# Patient Record
Sex: Female | Born: 1995 | Hispanic: Yes | Marital: Married | Smoking: Never smoker
Health system: Southern US, Community
[De-identification: ages and names within clinical notes are randomized; demographics above are authoritative.]

## PROBLEM LIST (undated history)

## (undated) ENCOUNTER — Inpatient Hospital Stay (HOSPITAL_COMMUNITY): Payer: Self-pay

## (undated) DIAGNOSIS — Z9889 Other specified postprocedural states: Secondary | ICD-10-CM

## (undated) DIAGNOSIS — Z789 Other specified health status: Secondary | ICD-10-CM

## (undated) DIAGNOSIS — O24419 Gestational diabetes mellitus in pregnancy, unspecified control: Secondary | ICD-10-CM

## (undated) HISTORY — DX: Gestational diabetes mellitus in pregnancy, unspecified control: O24.419

## (undated) HISTORY — PX: HAND SURGERY: SHX662

---
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
## 2016-06-18 ENCOUNTER — Inpatient Hospital Stay (HOSPITAL_COMMUNITY)
Admission: AD | Admit: 2016-06-18 | Discharge: 2016-06-18 | Disposition: A | Discharge status: Home / Self Care | Payer: Medicaid Other | Source: Ambulatory Visit | Attending: Obstetrics and Gynecology | Admitting: Obstetrics and Gynecology

## 2016-06-18 ENCOUNTER — Encounter (HOSPITAL_COMMUNITY): Payer: Self-pay | Admitting: *Deleted

## 2016-06-18 ENCOUNTER — Emergency Department (HOSPITAL_COMMUNITY)
Admission: EM | Admit: 2016-06-18 | Discharge: 2016-06-18 | Disposition: A | Discharge status: Home / Self Care | Payer: Medicaid Other

## 2016-06-18 ENCOUNTER — Inpatient Hospital Stay (HOSPITAL_COMMUNITY): Payer: Medicaid Other

## 2016-06-18 DIAGNOSIS — O209 Hemorrhage in early pregnancy, unspecified: Secondary | ICD-10-CM | POA: Insufficient documentation

## 2016-06-18 DIAGNOSIS — O4691 Antepartum hemorrhage, unspecified, first trimester: Secondary | ICD-10-CM | POA: Diagnosis not present

## 2016-06-18 DIAGNOSIS — R109 Unspecified abdominal pain: Secondary | ICD-10-CM | POA: Insufficient documentation

## 2016-06-18 DIAGNOSIS — Z3A08 8 weeks gestation of pregnancy: Secondary | ICD-10-CM | POA: Insufficient documentation

## 2016-06-18 DIAGNOSIS — O26891 Other specified pregnancy related conditions, first trimester: Secondary | ICD-10-CM | POA: Diagnosis not present

## 2016-06-18 DIAGNOSIS — N939 Abnormal uterine and vaginal bleeding, unspecified: Secondary | ICD-10-CM | POA: Diagnosis present

## 2016-06-18 HISTORY — DX: Other specified health status: Z78.9

## 2016-06-18 LAB — URINE MICROSCOPIC-ADD ON

## 2016-06-18 LAB — CBC
HEMATOCRIT: 36.9 % (ref 36.0–46.0)
HEMOGLOBIN: 12.6 g/dL (ref 12.0–15.0)
MCH: 28.6 pg (ref 26.0–34.0)
MCHC: 34.1 g/dL (ref 30.0–36.0)
MCV: 83.9 fL (ref 78.0–100.0)
Platelets: 278 10*3/uL (ref 150–400)
RBC: 4.4 MIL/uL (ref 3.87–5.11)
RDW: 14.3 % (ref 11.5–15.5)
WBC: 7.9 10*3/uL (ref 4.0–10.5)

## 2016-06-18 LAB — POCT PREGNANCY, URINE: Preg Test, Ur: POSITIVE — AB

## 2016-06-18 LAB — URINALYSIS, ROUTINE W REFLEX MICROSCOPIC
Bilirubin Urine: NEGATIVE
GLUCOSE, UA: NEGATIVE mg/dL
Ketones, ur: NEGATIVE mg/dL
Leukocytes, UA: NEGATIVE
Nitrite: NEGATIVE
PH: 7 (ref 5.0–8.0)
Protein, ur: NEGATIVE mg/dL
Specific Gravity, Urine: 1.01 (ref 1.005–1.030)

## 2016-06-18 LAB — WET PREP, GENITAL
CLUE CELLS WET PREP: NONE SEEN
SPERM: NONE SEEN
Trich, Wet Prep: NONE SEEN
WBC WET PREP: NONE SEEN
Yeast Wet Prep HPF POC: NONE SEEN

## 2016-06-18 LAB — ABO/RH: ABO/RH(D): A POS

## 2016-06-18 LAB — HCG, QUANTITATIVE, PREGNANCY: hCG, Beta Chain, Quant, S: 11071 m[IU]/mL — ABNORMAL HIGH (ref <5)

## 2016-06-18 NOTE — MAU Note (Addendum)
Pt had pos UPT @ Urgent Care a week ago.  Pt also states she has lower back pain as well as lower abd cramping.

## 2016-06-18 NOTE — MAU Note (Signed)
Has been spotting for the past wk, today became heavier- passing clots- dime sized or smaller.  Having some cramping in lower abd.  Had brown d/c

## 2016-06-18 NOTE — MAU Provider Note (Signed)
History     CSN: [id number]  Arrival date and time: 06/18/16 1718   First Provider Initiated Contact with Patient 06/18/16 1820      Chief Complaint  Patient presents with  . Vaginal Bleeding  . Abdominal Cramping  . Possible Pregnancy   HPI Eileen Morris is 20 y.o. G1P0 [redacted]w[redacted]d weeks presenting with cramping on and off starting last week, worsened today.  Pain is in the lower abdomen. Rates pain at this time 5 /10.  Nothing makes it worse or better.  Spotting, light pink off and on seen with wiping X 2 weeks.  Now bleeding is more red, like period and with small clots and brown discharge.  She has not begun prenatal care, plans in [place].    Past Medical History  Diagnosis Date  . Medical history non-contributory     Past Surgical History  Procedure Laterality Date  . Hand surgery      No family history on file.  Social History  Substance Use Topics  . Smoking status: Never Smoker   . Smokeless tobacco: None  . Alcohol Use: No    Allergies: No Known Allergies  Prescriptions prior to admission  Medication Sig Dispense Refill Last Dose  . acetaminophen (TYLENOL) 500 MG tablet Take 500 mg by mouth every 6 (six) hours as needed for headache.   06/17/2016 at Unknown time  . Prenatal Vit-Fe Fumarate-FA (PRENATAL MULTIVITAMIN) TABS tablet Take 1 tablet by mouth daily at 12 noon.   06/17/2016 at Unknown time    Review of Systems  Constitutional: Negative for fever and chills.  Cardiovascular: Negative for chest pain.  Gastrointestinal: Positive for abdominal pain (lower abdominal pain). Negative for nausea and vomiting.  Genitourinary: Negative for dysuria, urgency, frequency and hematuria.       + for vaginal bleeding with small clots  Neurological: Negative for headaches.   Physical Exam   Blood pressure 119/61, pulse 94, temperature 98.5 F (36.9 C), temperature source Oral, resp. rate 18, weight 165 lb (74.844 kg), last menstrual period 04/18/2016.  Physical Exam   Constitutional: She is oriented to person, place, and time. She appears well-developed and well-nourished. No distress.  HENT:  Head: Normocephalic.  Neck: Normal range of motion.  Cardiovascular: Normal rate.   Respiratory: Effort normal.  GI: Soft. She exhibits no distension and no mass. There is no tenderness. There is no rebound and no guarding.  Genitourinary: There is no rash, tenderness or lesion on the right labia. There is no rash, tenderness or lesion on the left labia. Uterus is tender. Uterus is not enlarged (mild tenderness ). Cervix exhibits no discharge. Right adnexum displays no mass, no tenderness and no fullness. Left adnexum displays no mass, no tenderness and no fullness. There is bleeding (small amount of dark red blood.  I did not see clots) in the vagina. No erythema or tenderness in the vagina. No vaginal discharge found.  Neurological: She is alert and oriented to person, place, and time.  Skin: Skin is warm and dry.  Psychiatric: She has a normal mood and affect. Her behavior is normal.   Results for orders placed or performed during the hospital encounter of 06/18/16 (from the past 24 hour(s))  Urinalysis, Routine w reflex microscopic (not at [facility])     Status: Abnormal   Collection Time: 06/18/16  5:30 PM  Result Value Ref Range   Color, Urine AMBER (A) YELLOW   APPearance HAZY (A) CLEAR   Specific Gravity, Urine 1.010 1.005 -  1.030   pH 7.0 5.0 - 8.0   Glucose, UA NEGATIVE NEGATIVE mg/dL   Hgb urine dipstick LARGE (A) NEGATIVE   Bilirubin Urine NEGATIVE NEGATIVE   Ketones, ur NEGATIVE NEGATIVE mg/dL   Protein, ur NEGATIVE NEGATIVE mg/dL   Nitrite NEGATIVE NEGATIVE   Leukocytes, UA NEGATIVE NEGATIVE  Urine microscopic-add on     Status: Abnormal   Collection Time: 06/18/16  5:30 PM  Result Value Ref Range   Squamous Epithelial / LPF 0-5 (A) NONE SEEN   WBC, UA 0-5 0 - 5 WBC/hpf   RBC / HPF 0-5 0 - 5 RBC/hpf   Bacteria, UA RARE (A) NONE SEEN  Pregnancy,  urine POC     Status: Abnormal   Collection Time: 06/18/16  6:10 PM  Result Value Ref Range   Preg Test, Ur POSITIVE (A) NEGATIVE  Wet prep, genital     Status: None   Collection Time: 06/18/16  6:32 PM  Result Value Ref Range   Yeast Wet Prep HPF POC NONE SEEN NONE SEEN   Trich, Wet Prep NONE SEEN NONE SEEN   Clue Cells Wet Prep HPF POC NONE SEEN NONE SEEN   WBC, Wet Prep HPF POC NONE SEEN NONE SEEN   Sperm NONE SEEN   CBC     Status: None   Collection Time: 06/18/16  6:35 PM  Result Value Ref Range   WBC 7.9 4.0 - 10.5 K/uL   RBC 4.40 3.87 - 5.11 MIL/uL   Hemoglobin 12.6 12.0 - 15.0 g/dL   HCT 16.1 09.6 - 04.5 %   MCV 83.9 78.0 - 100.0 fL   MCH 28.6 26.0 - 34.0 pg   MCHC 34.1 30.0 - 36.0 g/dL   RDW 40.9 81.1 - 91.4 %   Platelets 278 150 - 400 K/uL  ABO/Rh     Status: None (Preliminary result)   Collection Time: 06/18/16  6:36 PM  Result Value Ref Range   ABO/RH(D) A POS   hCG, quantitative, pregnancy     Status: Abnormal   Collection Time: 06/18/16  6:36 PM  Result Value Ref Range   hCG, Beta Chain, Quant, S 11071 (H) <5 mIU/mL    US Ob Comp Less 14 Wks  06/18/2016  CLINICAL DATA:  Pregnant patient in first-trimester pregnancy with vaginal bleeding and lower abdominal cramping for 1 week. EXAM: OBSTETRIC <14 WK [place] AND TRANSVAGINAL OB US TECHNIQUE: Both transabdominal and transvaginal ultrasound examinations were performed for complete evaluation of the gestation as well as the maternal uterus, adnexal regions, and pelvic cul-de-sac. Transvaginal technique was performed to assess early pregnancy. COMPARISON:  None. FINDINGS: Intrauterine gestational sac: Single. Yolk sac:  Present. Embryo:  Not visualized. Cardiac Activity: Not visualized. MSD: 12.5  mm   6 w   0  d Subchorionic hemorrhage:  None visualized. Maternal uterus/adnexae: Both ovaries are visualized and normal. Trace pelvic free fluid. IMPRESSION: Probable early intrauterine gestational sac and yolk sac, but no fetal  pole or cardiac activity yet visualized. Recommend follow-up quantitative B-HCG levels and follow-up US in 10 days to confirm and assess viability. This recommendation follows SRU consensus guidelines: Diagnostic Criteria for Nonviable Pregnancy Early in the First Trimester. Malva Limes Med 2013; 782:9562-13. Electronically Signed   By: Rubye Oaks M.D.   On: 06/18/2016 19:34   US Ob Transvaginal  06/18/2016  CLINICAL DATA:  Pregnant patient in first-trimester pregnancy with vaginal bleeding and lower abdominal cramping for 1 week. EXAM: OBSTETRIC <14 WK [place] AND  TRANSVAGINAL OB US TECHNIQUE: Both transabdominal and transvaginal ultrasound examinations were performed for complete evaluation of the gestation as well as the maternal uterus, adnexal regions, and pelvic cul-de-sac. Transvaginal technique was performed to assess early pregnancy. COMPARISON:  None. FINDINGS: Intrauterine gestational sac: Single. Yolk sac:  Present. Embryo:  Not visualized. Cardiac Activity: Not visualized. MSD: 12.5  mm   6 w   0  d Subchorionic hemorrhage:  None visualized. Maternal uterus/adnexae: Both ovaries are visualized and normal. Trace pelvic free fluid. IMPRESSION: Probable early intrauterine gestational sac and yolk sac, but no fetal pole or cardiac activity yet visualized. Recommend follow-up quantitative B-HCG levels and follow-up US in 10 days to confirm and assess viability. This recommendation follows SRU consensus guidelines: Diagnostic Criteria for Nonviable Pregnancy Early in the First Trimester. Malva Limes Engl J Med 2013; 161:0960-45; 369:1443-51. Electronically Signed   By: Rubye OaksMelanie  Ehinger M.D.   On: 06/18/2016 19:34   MAU Course  Procedures  GC/CHL culture and HIV results pending  MDM MSE Labs Exam U/S Reviewed testing results with the patient.   Assessment and Plan  A: Vaginal bleeding in first trimester pregnancy      Abdominal pain in first trimester pregnancy       Intrauterine Gestational Sac with Yolk Sac seen  on U/S      [id number] by U/S  (dating by LMP of [id number])  P: Pelvic rest until bleeding stops      Begin prenatal care with MD of your choice      Return for worsening sxs  Eileen Morris,EVE M 06/18/2016, 8:00 PM

## 2016-06-18 NOTE — Discharge Instructions (Signed)
Vaginal Bleeding During Pregnancy, First Trimester °A small amount of bleeding (spotting) from the vagina is common in early pregnancy. Sometimes the bleeding is normal and is not a problem, and sometimes it is a sign of something serious. Be sure to tell your doctor about any bleeding from your vagina right away. °HOME CARE °· Watch your condition for any changes. °· Follow your doctor's instructions about how active you can be. °· If you are on bed rest: °¨ You may need to stay in bed and only get up to use the bathroom. °¨ You may be allowed to do some activities. °¨ If you need help, make plans for someone to help you. °· Write down: °¨ The number of pads you use each day. °¨ How often you change pads. °¨ How soaked (saturated) your pads are. °· Do not use tampons. °· Do not douche. °· Do not have sex or orgasms until your doctor says it is okay. °· If you pass any tissue from your vagina, save the tissue so you can show it to your doctor. °· Only take medicines as told by your doctor. °· Do not take aspirin because it can make you bleed. °· Keep all follow-up visits as told by your doctor. °GET HELP IF:  °· You bleed from your vagina. °· You have cramps. °· You have labor pains. °· You have a fever that does not go away after you take medicine. °GET HELP RIGHT AWAY IF:  °· You have very bad cramps in your back or belly (abdomen). °· You pass large clots or tissue from your vagina. °· You bleed more. °· You feel light-headed or weak. °· You pass out (faint). °· You have chills. °· You are leaking fluid or have a gush of fluid from your vagina. °· You pass out while pooping (having a bowel movement). °MAKE SURE YOU: °· Understand these instructions. °· Will watch your condition. °· Will get help right away if you are not doing well or get worse. °  °This information is not intended to replace advice given to you by your health care provider. Make sure you discuss any questions you have with your health care  provider. °  °Document Released: 04/05/2014 Document Reviewed: 04/05/2014 °Elsevier Interactive Patient Education ©2016 Elsevier Inc. ° °Pelvic Rest °Pelvic rest is sometimes recommended for women when:  °· The placenta is partially or completely covering the opening of the cervix (placenta previa). °· There is bleeding between the uterine wall and the amniotic sac in the first trimester (subchorionic hemorrhage). °· The cervix begins to open without labor starting (incompetent cervix, cervical insufficiency). °· The labor is too early (preterm labor). °HOME CARE INSTRUCTIONS °· Do not have sexual intercourse, stimulation, or an orgasm. °· Do not use tampons, douche, or put anything in the vagina. °· Do not lift anything over 10 pounds (4.5 kg). °· Avoid strenuous activity or straining your pelvic muscles. °SEEK MEDICAL CARE IF:  °· You have any vaginal bleeding during pregnancy. Treat this as a potential emergency. °· You have cramping pain felt low in the stomach (stronger than menstrual cramps). °· You notice vaginal discharge (watery, mucus, or bloody). °· You have a low, dull backache. °· There are regular contractions or uterine tightening. °SEEK IMMEDIATE MEDICAL CARE IF: °You have vaginal bleeding and have placenta previa.  °  °This information is not intended to replace advice given to you by your health care provider. Make sure you discuss any questions you have with   your health care provider. °  °Document Released: 03/16/2011 Document Revised: 02/11/2012 Document Reviewed: 05/23/2015 °Elsevier Interactive Patient Education ©2016 Elsevier Inc. ° °

## 2016-06-19 ENCOUNTER — Encounter (HOSPITAL_COMMUNITY): Payer: Self-pay | Admitting: *Deleted

## 2016-06-19 ENCOUNTER — Inpatient Hospital Stay (HOSPITAL_COMMUNITY): Payer: Medicaid Other

## 2016-06-19 ENCOUNTER — Inpatient Hospital Stay (HOSPITAL_COMMUNITY)
Admission: AD | Admit: 2016-06-19 | Discharge: 2016-06-19 | Disposition: A | Discharge status: Home / Self Care | Payer: Medicaid Other | Source: Ambulatory Visit | Attending: Obstetrics & Gynecology | Admitting: Obstetrics & Gynecology

## 2016-06-19 DIAGNOSIS — O26892 Other specified pregnancy related conditions, second trimester: Secondary | ICD-10-CM | POA: Diagnosis present

## 2016-06-19 DIAGNOSIS — O209 Hemorrhage in early pregnancy, unspecified: Secondary | ICD-10-CM | POA: Insufficient documentation

## 2016-06-19 DIAGNOSIS — Z3A08 8 weeks gestation of pregnancy: Secondary | ICD-10-CM | POA: Insufficient documentation

## 2016-06-19 DIAGNOSIS — R109 Unspecified abdominal pain: Secondary | ICD-10-CM | POA: Diagnosis present

## 2016-06-19 DIAGNOSIS — O200 Threatened abortion: Secondary | ICD-10-CM

## 2016-06-19 LAB — GC/CHLAMYDIA PROBE AMP (~~LOC~~) NOT AT ARMC
Chlamydia: NEGATIVE
Neisseria Gonorrhea: NEGATIVE

## 2016-06-19 LAB — HIV ANTIBODY (ROUTINE TESTING W REFLEX): HIV Screen 4th Generation wRfx: NONREACTIVE

## 2016-06-19 MED ORDER — OXYCODONE-ACETAMINOPHEN 5-325 MG PO TABS: 1.0000 | ORAL_TABLET | ORAL | Status: DC | PRN

## 2016-06-19 MED ORDER — HYDROMORPHONE HCL 1 MG/ML IJ SOLN
0.5000 mg | Freq: Once | INTRAMUSCULAR | Status: AC
  Administered 2016-06-19: 0.5 mg via INTRAMUSCULAR
  Filled 2016-06-19: qty 1

## 2016-06-19 NOTE — MAU Note (Signed)
Patient in [place]. 

## 2016-06-19 NOTE — MAU Provider Note (Signed)
Chief Complaint: Vaginal Bleeding and Abdominal Pain   Provider saw patient at 1840   SUBJECTIVE  PI: Eileen Morris is a 20 y.o. G1P0 at 6576w6d by LMP who presents to maternity admissions reporting increased bleeding and cramping today.  Passed a grape sized blood clot, worried it is the baby.  She denies vaginal itching/burning, urinary symptoms, h/a, dizziness, n/v, or fever/chills.    Seen yesterday for bleeding and cramping.  US showed gestational sac and yolk sac.  This effectively ruled out ectopic so she was discharged home.    Vaginal Bleeding The patient's primary symptoms include pelvic pain and vaginal bleeding. The patient's pertinent negatives include no genital itching, genital lesions or genital odor. This is a recurrent problem. The current episode started today. The problem occurs intermittently. The pain is mild. The problem affects both sides. She is pregnant. Associated symptoms include abdominal pain. Pertinent negatives include no back pain, chills, constipation, diarrhea, fever, headaches or nausea. The vaginal discharge was bloody. The vaginal bleeding is typical of menses. She has been passing clots. She has been passing tissue. Nothing aggravates the symptoms. She has tried nothing for the symptoms.  Abdominal Pain Pertinent negatives include no constipation, diarrhea, fever, headaches or nausea.    Past Medical History  Diagnosis Date  . Medical history non-contributory    Past Surgical History  Procedure Laterality Date  . Hand surgery     Social History   Social History  . Marital Status: Single    Spouse Name: N/A  . Number of Children: N/A  . Years of Education: N/A   Occupational History  . Not on file.   Social History Main Topics  . Smoking status: Never Smoker   . Smokeless tobacco: Not on file  . Alcohol Use: No  . Drug Use: No  . Sexual Activity: Yes   Other Topics Concern  . Not on file   Social History Narrative   No current  facility-administered medications on file prior to encounter.   Current Outpatient Prescriptions on File Prior to Encounter  Medication Sig Dispense Refill  . acetaminophen (TYLENOL) 500 MG tablet Take 500 mg by mouth every 6 (six) hours as needed for headache.    . Prenatal Vit-Fe Fumarate-FA (PRENATAL MULTIVITAMIN) TABS tablet Take 1 tablet by mouth daily at 12 noon.     No Known Allergies  I have reviewed patient's Past Medical Hx, Surgical Hx, Family Hx, Social Hx, medications and allergies.   ROS:  Review of Systems  Constitutional: Negative for fever and chills.  Gastrointestinal: Positive for abdominal pain. Negative for nausea, diarrhea and constipation.  Genitourinary: Positive for vaginal bleeding and pelvic pain.  Musculoskeletal: Negative for back pain.  Neurological: Negative for headaches.   Other systems negative  Physical Exam  Patient Vitals for the past 24 hrs:  BP Temp Temp src Pulse Resp  06/19/16 1836 136/70 mmHg 98 F (36.7 C) Oral 116 20    Physical Exam  Constitutional: Well-developed, well-nourished female in no acute distress.  Cardiovascular: normal rate Respiratory: normal effort GI: Abd soft, non-tender. Pos BS x 4 MS: Extremities nontender, no edema, normal ROM Neurologic: Alert and oriented x 4.  GU: Neg CVAT.  Moderate bleeding, small clot on pad.  LAB RESULTS No results found for this or any previous visit (from the past 24 hour(s)).  --/--/A POS (07/17 1836)  IMAGING [place] Ob Comp Less 14 Wks  06/18/2016  CLINICAL DATA:  Pregnant patient in first-trimester pregnancy with vaginal  bleeding and lower abdominal cramping for 1 week. EXAM: OBSTETRIC <14 WK [place] AND TRANSVAGINAL OB US TECHNIQUE: Both transabdominal and transvaginal ultrasound examinations were performed for complete evaluation of the gestation as well as the maternal uterus, adnexal regions, and pelvic cul-de-sac. Transvaginal technique was performed to assess early pregnancy.  COMPARISON:  None. FINDINGS: Intrauterine gestational sac: Single. Yolk sac:  Present. Embryo:  Not visualized. Cardiac Activity: Not visualized. MSD: 12.5  mm   6 w   0  d Subchorionic hemorrhage:  None visualized. Maternal uterus/adnexae: Both ovaries are visualized and normal. Trace pelvic free fluid. IMPRESSION: Probable early intrauterine gestational sac and yolk sac, but no fetal pole or cardiac activity yet visualized. Recommend follow-up quantitative B-HCG levels and follow-up US in 10 days to confirm and assess viability. This recommendation follows SRU consensus guidelines: Diagnostic Criteria for Nonviable Pregnancy Early in the First Trimester. Malva Limes Med 2013; 454:0981-19. Electronically Signed   By: Rubye Oaks M.D.   On: 06/18/2016 19:34   US Ob Transvaginal  06/19/2016  CLINICAL DATA:  Pregnant patient in first-trimester pregnancy with increased vaginal bleeding. EXAM: TRANSVAGINAL OB ULTRASOUND TECHNIQUE: Transvaginal ultrasound was performed for complete evaluation of the gestation as well as the maternal uterus, adnexal regions, and pelvic cul-de-sac. COMPARISON:  Obstetric ultrasound yesterday at 1911 hour FINDINGS: Intrauterine gestational sac: Present, however is now located in the lower uterine segment. Yolk sac:  Present. Embryo:  Not visualized. Cardiac Activity: Not visualized. MSD: 13  mm   6 w   1  d Subchorionic hemorrhage:  None visualized. Maternal uterus/adnexae: Both ovaries are visualized and are normal. There is no pelvic free fluid. IMPRESSION: The intrauterine gestational sac containing a yolk sac is again seen, however is now located in the lower uterine segment, a change from exam performed yesterday. No fetal pole or cardiac activity. Findings are suspicious for failed pregnancy. Electronically Signed   By: Rubye Oaks M.D.   On: 06/19/2016 19:44   US Ob Transvaginal  06/18/2016  CLINICAL DATA:  Pregnant patient in first-trimester pregnancy with vaginal  bleeding and lower abdominal cramping for 1 week. EXAM: OBSTETRIC <14 WK [place] AND TRANSVAGINAL OB US TECHNIQUE: Both transabdominal and transvaginal ultrasound examinations were performed for complete evaluation of the gestation as well as the maternal uterus, adnexal regions, and pelvic cul-de-sac. Transvaginal technique was performed to assess early pregnancy. COMPARISON:  None. FINDINGS: Intrauterine gestational sac: Single. Yolk sac:  Present. Embryo:  Not visualized. Cardiac Activity: Not visualized. MSD: 12.5  mm   6 w   0  d Subchorionic hemorrhage:  None visualized. Maternal uterus/adnexae: Both ovaries are visualized and normal. Trace pelvic free fluid. IMPRESSION: Probable early intrauterine gestational sac and yolk sac, but no fetal pole or cardiac activity yet visualized. Recommend follow-up quantitative B-HCG levels and follow-up US in 10 days to confirm and assess viability. This recommendation follows SRU consensus guidelines: Diagnostic Criteria for Nonviable Pregnancy Early in the First Trimester. Malva Limes Med 2013; 147:8295-62. Electronically Signed   By: Rubye Oaks M.D.   On: 06/18/2016 19:34     MAU Management/MDM: Follow up ultrasound ordered to determine presence of gestational sac  Discussed US findings.  Patient and family told this is likely a miscarriage in progress.  Though we cannot say this 100%, the change in location inside uterus indicates high probability.  Offered follow up with our clinic.  Patient states has appt in [place] in [place], and wants to follow up there.  This bleeding/pain can represent a normal pregnancy with bleeding, spontaneous abortion or even an ectopic which can be life-threatening.  The process as listed above helps to determine which of these is present.    ASSESSMENT 1. Bleeding in early pregnancy   2.     Gestational sac lower in uterus  PLAN Discharge home Follow up at [facility] in [place] Bleeding precautions     Medication List    ASK your doctor about these medications        acetaminophen 500 MG tablet  Commonly known as:  TYLENOL  Take 500 mg by mouth every 6 (six) hours as needed for headache.     prenatal multivitamin Tabs tablet  Take 1 tablet by mouth daily at 12 noon.        Pt stable at time of discharge. Encouraged to return here or to other Urgent Care/ED if she develops worsening of symptoms, increase in pain, fever, or other concerning symptoms.    Wynelle Bourgeois CNM, MSN Certified Nurse-Midwife 06/19/2016  7:00 PM

## 2016-06-19 NOTE — Discharge Instructions (Signed)
Pelvic Rest °Pelvic rest is sometimes recommended for women when:  °· The placenta is partially or completely covering the opening of the cervix (placenta previa). °· There is bleeding between the uterine wall and the amniotic sac in the first trimester (subchorionic hemorrhage). °· The cervix begins to open without labor starting (incompetent cervix, cervical insufficiency). °· The labor is too early (preterm labor). °HOME CARE INSTRUCTIONS °· Do not have sexual intercourse, stimulation, or an orgasm. °· Do not use tampons, douche, or put anything in the vagina. °· Do not lift anything over 10 pounds (4.5 kg). °· Avoid strenuous activity or straining your pelvic muscles. °SEEK MEDICAL CARE IF:  °· You have any vaginal bleeding during pregnancy. Treat this as a potential emergency. °· You have cramping pain felt low in the stomach (stronger than menstrual cramps). °· You notice vaginal discharge (watery, mucus, or bloody). °· You have a low, dull backache. °· There are regular contractions or uterine tightening. °SEEK IMMEDIATE MEDICAL CARE IF: °You have vaginal bleeding and have placenta previa.  °  °This information is not intended to replace advice given to you by your health care provider. Make sure you discuss any questions you have with your health care provider. °  °Document Released: 03/16/2011 Document Revised: 02/11/2012 Document Reviewed: 05/23/2015 °Elsevier Interactive Patient Education ©2016 Elsevier Inc. ° °Vaginal Bleeding During Pregnancy, First Trimester °A small amount of bleeding (spotting) from the vagina is relatively common in early pregnancy. It usually stops on its own. Various things may cause bleeding or spotting in early pregnancy. Some bleeding may be related to the pregnancy, and some may not. In most cases, the bleeding is normal and is not a problem. However, bleeding can also be a sign of something serious. Be sure to tell your health care provider about any vaginal bleeding right  away. °Some possible causes of vaginal bleeding during the first trimester include: °· Infection or inflammation of the cervix. °· Growths (polyps) on the cervix. °· Miscarriage or threatened miscarriage. °· Pregnancy tissue has developed outside of the uterus and in a fallopian tube (tubal pregnancy). °· Tiny cysts have developed in the uterus instead of pregnancy tissue (molar pregnancy). °HOME CARE INSTRUCTIONS  °Watch your condition for any changes. The following actions may help to lessen any discomfort you are feeling: °· Follow your health care provider's instructions for limiting your activity. If your health care provider orders bed rest, you may need to stay in bed and only get up to use the bathroom. However, your health care provider may allow you to continue light activity. °· If needed, make plans for someone to help with your regular activities and responsibilities while you are on bed rest. °· Keep track of the number of pads you use each day, how often you change pads, and how soaked (saturated) they are. Write this down. °· Do not use tampons. Do not douche. °· Do not have sexual intercourse or orgasms until approved by your health care provider. °· If you pass any tissue from your vagina, save the tissue so you can show it to your health care provider. °· Only take over-the-counter or prescription medicines as directed by your health care provider. °· Do not take aspirin because it can make you bleed. °· Keep all follow-up appointments as directed by your health care provider. °SEEK MEDICAL CARE IF: °· You have any vaginal bleeding during any part of your pregnancy. °· You have cramps or labor pains. °· You have a fever, not   controlled by medicine. °SEEK IMMEDIATE MEDICAL CARE IF:  °· You have severe cramps in your back or belly (abdomen). °· You pass large clots or tissue from your vagina. °· Your bleeding increases. °· You feel light-headed or weak, or you have fainting episodes. °· You have  chills. °· You are leaking fluid or have a gush of fluid from your vagina. °· You pass out while having a bowel movement. °MAKE SURE YOU: °· Understand these instructions. °· Will watch your condition. °· Will get help right away if you are not doing well or get worse. °  °This information is not intended to replace advice given to you by your health care provider. Make sure you discuss any questions you have with your health care provider. °  °Document Released: 08/29/2005 Document Revised: 11/24/2013 Document Reviewed: 07/27/2013 °Elsevier Interactive Patient Education ©2016 Elsevier Inc. ° °

## 2016-06-19 NOTE — MAU Note (Addendum)
Pt seen in MAU yesterday, bleeding has become heavier today, passing more clots.  Lower abd pain also more intense today.  "I think I'm passing something big right now,"

## 2016-11-29 ENCOUNTER — Encounter (HOSPITAL_COMMUNITY): Payer: Self-pay

## 2017-04-23 ENCOUNTER — Encounter (HOSPITAL_COMMUNITY): Payer: Self-pay

## 2017-11-12 ENCOUNTER — Encounter: Payer: Medicaid Other | Admitting: Certified Nurse Midwife

## 2017-11-12 ENCOUNTER — Inpatient Hospital Stay (HOSPITAL_COMMUNITY)
Admission: AD | Admit: 2017-11-12 | Discharge: 2017-11-12 | Disposition: A | Discharge status: Home / Self Care | Payer: Medicaid Other | Source: Ambulatory Visit | Attending: Family Medicine | Admitting: Family Medicine

## 2017-11-12 ENCOUNTER — Other Ambulatory Visit: Payer: Self-pay

## 2017-11-12 ENCOUNTER — Encounter (HOSPITAL_COMMUNITY): Payer: Self-pay | Admitting: *Deleted

## 2017-11-12 DIAGNOSIS — J069 Acute upper respiratory infection, unspecified: Secondary | ICD-10-CM | POA: Diagnosis not present

## 2017-11-12 DIAGNOSIS — O99511 Diseases of the respiratory system complicating pregnancy, first trimester: Secondary | ICD-10-CM | POA: Diagnosis not present

## 2017-11-12 DIAGNOSIS — O9989 Other specified diseases and conditions complicating pregnancy, childbirth and the puerperium: Secondary | ICD-10-CM | POA: Diagnosis not present

## 2017-11-12 DIAGNOSIS — Z3A13 13 weeks gestation of pregnancy: Secondary | ICD-10-CM | POA: Diagnosis not present

## 2017-11-12 DIAGNOSIS — J029 Acute pharyngitis, unspecified: Secondary | ICD-10-CM | POA: Diagnosis present

## 2017-11-12 LAB — INFLUENZA PANEL BY PCR (TYPE A & B)
INFLAPCR: NEGATIVE
INFLBPCR: NEGATIVE

## 2017-11-12 LAB — URINALYSIS, ROUTINE W REFLEX MICROSCOPIC
Bilirubin Urine: NEGATIVE
Glucose, UA: 50 mg/dL — AB
HGB URINE DIPSTICK: NEGATIVE
Ketones, ur: NEGATIVE mg/dL
LEUKOCYTES UA: NEGATIVE
NITRITE: NEGATIVE
PROTEIN: NEGATIVE mg/dL
Specific Gravity, Urine: 1.021 (ref 1.005–1.030)
pH: 5 (ref 5.0–8.0)

## 2017-11-12 LAB — RAPID STREP SCREEN (MED CTR MEBANE ONLY): STREPTOCOCCUS, GROUP A SCREEN (DIRECT): NEGATIVE

## 2017-11-12 MED ORDER — ONDANSETRON 8 MG PO TBDP: 8.0000 mg | ORAL_TABLET | Freq: Three times a day (TID) | ORAL | 0 refills | Status: DC | PRN

## 2017-11-12 NOTE — MAU Provider Note (Signed)
History     CSN: [id number]  Arrival date and time: 11/12/17 [id number]   First Provider Initiated Contact with Patient 11/12/17 1005      Chief Complaint  Patient presents with  . Fever  . Sore Throat  . Earache   Eileen Morris is a 21 y.o. G2P0 at Unknown who presents today with fever, sore throat, and earache. She states that over the last couple of days she was having a lot of phlegm. She was vomiting up the phlegm, and now feels like she is vomiting "all the time".    URI   This is a new problem. The current episode started yesterday. The problem has been unchanged. The maximum temperature recorded prior to her arrival was 100.4 - 100.9 F. The fever has been present for less than 1 day. Associated symptoms include ear pain, a sore throat and vomiting. She has tried acetaminophen for the symptoms. The treatment provided mild relief.    Past Medical History:  Diagnosis Date  . Medical history non-contributory     Past Surgical History:  Procedure Laterality Date  . HAND SURGERY      No family history on file.  Social History   Tobacco Use  . Smoking status: Never Smoker  Substance Use Topics  . Alcohol use: No  . Drug use: No    Allergies: No Known Allergies  Medications Prior to Admission  Medication Sig Dispense Refill Last Dose  . acetaminophen (TYLENOL) 500 MG tablet Take 500 mg by mouth every 6 (six) hours as needed for headache.   06/17/2016 at Unknown time  . oxyCODONE-acetaminophen (PERCOCET/ROXICET) 5-325 MG tablet Take 1 tablet by mouth every 4 (four) hours as needed. 15 tablet 0   . Prenatal Vit-Fe Fumarate-FA (PRENATAL MULTIVITAMIN) TABS tablet Take 1 tablet by mouth daily at 12 noon.   06/17/2016 at Unknown time    Review of Systems  Constitutional: Positive for fever. Negative for chills.  HENT: Positive for ear pain, nosebleeds and sore throat.   Gastrointestinal: Positive for vomiting.  Genitourinary: Negative for pelvic pain, vaginal bleeding  and vaginal discharge.   Physical Exam   Blood pressure 131/71, pulse (!) 112, temperature 98.3 F (36.8 C), temperature source Oral, resp. rate 20, height 5\' 4"  (1.626 m), weight 184 lb 8 oz (83.7 kg), last menstrual period 07/26/2017, SpO2 99 %, unknown if currently breastfeeding.  Physical Exam  Nursing note and vitals reviewed. Constitutional: She is oriented to person, place, and time. She appears well-developed and well-nourished. No distress.  HENT:  Head: Normocephalic.  Right Ear: Tympanic membrane is not erythematous. A middle ear effusion is present.  Left Ear: Tympanic membrane is not erythematous. A middle ear effusion is present.  Mouth/Throat: Posterior oropharyngeal erythema present. No oropharyngeal exudate, posterior oropharyngeal edema or tonsillar abscesses.  Cardiovascular: Normal rate.  Respiratory: Effort normal.  GI: Soft. There is no tenderness. There is no rebound.  Genitourinary:  Genitourinary Comments: FHT: 160 with doppler   Neurological: She is alert and oriented to person, place, and time.  Skin: Skin is warm and dry.  Psychiatric: She has a normal mood and affect.   Results for orders placed or performed during the hospital encounter of 11/12/17 (from the past 24 hour(s))  Urinalysis, Routine w reflex microscopic     Status: Abnormal   Collection Time: 11/12/17 10:00 AM  Result Value Ref Range   Color, Urine YELLOW YELLOW   APPearance HAZY (A) CLEAR   Specific Gravity, Urine  1.021 1.005 - 1.030   pH 5.0 5.0 - 8.0   Glucose, UA 50 (A) NEGATIVE mg/dL   Hgb urine dipstick NEGATIVE NEGATIVE   Bilirubin Urine NEGATIVE NEGATIVE   Ketones, ur NEGATIVE NEGATIVE mg/dL   Protein, ur NEGATIVE NEGATIVE mg/dL   Nitrite NEGATIVE NEGATIVE   Leukocytes, UA NEGATIVE NEGATIVE  Influenza panel by PCR (type A & B)     Status: None   Collection Time: 11/12/17 10:02 AM  Result Value Ref Range   Influenza A By PCR NEGATIVE NEGATIVE   Influenza B By PCR NEGATIVE  NEGATIVE    MAU Course  Procedures  MDM Flu negative Strep pending, will DC home, and call patient with results later if positive.   Assessment and Plan   1. Viral URI   2. [redacted] weeks gestation of pregnancy    DC home Comfort measures reviewed Safe medication list given   2nd Trimester precautions  RX: zofran 8mg s ODT PRN #20  Return to MAU as needed FU with OB as planned  Follow-up Information    Marcelle OverlieGrewal, Michelle, MD Follow up.   Specialty:  Obstetrics and Gynecology Contact information: [address] [place] [id number] [phone]            Thressa ShellerHeather Hogan 11/12/2017, 10:07 AM

## 2017-11-12 NOTE — MAU Note (Signed)
Pt presents with c/o fever, sore throat, earache, and phlegm.  Pt reports fever 100.6 last pm.  Took Tylenol, fever reduced.  Denies VB.

## 2017-11-12 NOTE — Discharge Instructions (Signed)

## 2017-11-13 ENCOUNTER — Ambulatory Visit (INDEPENDENT_AMBULATORY_CARE_PROVIDER_SITE_OTHER): Payer: Medicaid Other | Admitting: Obstetrics

## 2017-11-13 ENCOUNTER — Other Ambulatory Visit (HOSPITAL_COMMUNITY)
Admission: RE | Admit: 2017-11-13 | Discharge: 2017-11-13 | Disposition: A | Discharge status: Home / Self Care | Payer: Medicaid Other | Source: Ambulatory Visit | Attending: Obstetrics | Admitting: Obstetrics

## 2017-11-13 ENCOUNTER — Encounter: Payer: Self-pay | Admitting: Obstetrics

## 2017-11-13 VITALS — BP 118/75 | HR 95 | Wt 185.0 lb

## 2017-11-13 DIAGNOSIS — Z3480 Encounter for supervision of other normal pregnancy, unspecified trimester: Secondary | ICD-10-CM | POA: Diagnosis not present

## 2017-11-13 DIAGNOSIS — Z3482 Encounter for supervision of other normal pregnancy, second trimester: Secondary | ICD-10-CM

## 2017-11-13 DIAGNOSIS — Z23 Encounter for immunization: Secondary | ICD-10-CM

## 2017-11-13 DIAGNOSIS — M549 Dorsalgia, unspecified: Secondary | ICD-10-CM

## 2017-11-13 MED ORDER — COMFORT FIT MATERNITY SUPP SM MISC: 0 refills | Status: DC

## 2017-11-13 MED ORDER — PRENATE PIXIE 10-0.6-0.4-200 MG PO CAPS: 1.0000 | ORAL_CAPSULE | Freq: Every day | ORAL | 4 refills | Status: DC

## 2017-11-13 NOTE — Progress Notes (Signed)
Subjective:    Eileen Morris is being seen today for her first obstetrical visit.  This is a planned pregnancy. She is at 3225w5d gestation. Her obstetrical history is significant for none. Relationship with FOB: spouse, living together. Patient does intend to breast feed. Pregnancy history fully reviewed.  The information documented in the HPI was reviewed and verified.  Menstrual History: OB History    Gravida Para Term Preterm AB Living   3       2     SAB TAB Ectopic Multiple Live Births   2               Patient's last menstrual period was 07/26/2017.    Past Medical History:  Diagnosis Date  . Medical history non-contributory     Past Surgical History:  Procedure Laterality Date  . HAND SURGERY       (Not in a hospital admission) No Known Allergies  Social History   Tobacco Use  . Smoking status: Never Smoker  . Smokeless tobacco: Never Used  Substance Use Topics  . Alcohol use: No    Family History  Problem Relation Age of Onset  . Hypertension Mother   . Hypertension Maternal Aunt   . Cancer Paternal Grandmother      Review of Systems Constitutional: negative for weight loss Gastrointestinal: negative for vomiting Genitourinary:negative for genital lesions and vaginal discharge and dysuria Musculoskeletal:negative for back pain Behavioral/Psych: negative for abusive relationship, depression, illegal drug usage and tobacco use    Objective:    BP 118/75   Pulse 95   Wt 185 lb (83.9 kg)   LMP 07/26/2017   BMI 31.76 kg/m  General Appearance:    Alert, cooperative, no distress, appears stated age  Head:    Normocephalic, without obvious abnormality, atraumatic  Eyes:    PERRL, conjunctiva/corneas clear, EOM's intact, fundi    benign, both eyes  Ears:    Normal TM's and external ear canals, both ears  Nose:   Nares normal, septum midline, mucosa normal, no drainage    or sinus tenderness  Throat:   Lips, mucosa, and tongue normal; teeth and gums  normal  Neck:   Supple, symmetrical, trachea midline, no adenopathy;    thyroid:  no enlargement/tenderness/nodules; no carotid   bruit or JVD  Back:     Symmetric, no curvature, ROM normal, no CVA tenderness  Lungs:     Clear to auscultation bilaterally, respirations unlabored  Chest Wall:    No tenderness or deformity   Heart:    Regular rate and rhythm, S1 and S2 normal, no murmur, rub   or gallop  Breast Exam:    No tenderness, masses, or nipple abnormality  Abdomen:     Soft, non-tender, bowel sounds active all four quadrants,    no masses, no organomegaly  Genitalia:    Normal female without lesion, discharge or tenderness  Extremities:   Extremities normal, atraumatic, no cyanosis or edema  Pulses:   2+ and symmetric all extremities  Skin:   Skin color, texture, turgor normal, no rashes or lesions  Lymph nodes:   Cervical, supraclavicular, and axillary nodes normal  Neurologic:   CNII-XII intact, normal strength, sensation and reflexes    throughout      Lab Review Urine pregnancy test Labs reviewed yes Radiologic studies reviewed no Assessment:    Pregnancy at 6425w5d weeks    Plan:     1. Supervision of other normal pregnancy, antepartum Rx: -  Cervicovaginal ancillary only - Culture, OB Urine - Cystic Fibrosis Mutation 97 - Cytology - PAP - Hemoglobinopathy evaluation - Obstetric Panel, Including HIV - VITAMIN D 25 Hydroxy (Vit-D Deficiency, Fractures) - Flu Vaccine QUAD 36+ mos IM - US MFM OB COMP + 14 WK; Future - Prenat-FeAsp-Meth-FA-DHA w/o A (PRENATE PIXIE) 10-0.6-0.4-200 MG CAPS; Take 1 capsule by mouth daily before breakfast.  Dispense: 90 capsule; Refill: 4  2. Need for vaccination   3. Backache symptom Rx: - Elastic Bandages & Supports (COMFORT FIT MATERNITY SUPP SM) MISC; Wear as directed.  Dispense: 1 each; Refill: 0  Prenatal vitamins.  Counseling provided regarding continued use of seat belts, cessation of alcohol consumption, smoking or use of  illicit drugs; infection precautions i.e., influenza/TDAP immunizations, toxoplasmosis,CMV, parvovirus, listeria and varicella; workplace safety, exercise during pregnancy; routine dental care, safe medications, sexual activity, hot tubs, saunas, pools, travel, caffeine use, fish and methlymercury, potential toxins, hair treatments, varicose veins Weight gain recommendations per IOM guidelines reviewed: underweight/BMI< 18.5--> gain 28 - 40 lbs; normal weight/BMI 18.5 - 24.9--> gain 25 - 35 lbs; overweight/BMI 25 - 29.9--> gain 15 - 25 lbs; obese/BMI >30->gain  11 - 20 lbs Problem list reviewed and updated. FIRST/CF mutation testing/NIPT/QUAD SCREEN/fragile X/Ashkenazi Jewish population testing/Spinal muscular atrophy discussed: declined. Role of ultrasound in pregnancy discussed; fetal survey: requested. Amniocentesis discussed: not indicated. VBAC calculator score: VBAC consent form provided No orders of the defined types were placed in this encounter.  No orders of the defined types were placed in this encounter.   Follow up in 4 weeks. 50% of 20 min visit spent on counseling and coordination of care.

## 2017-11-13 NOTE — Progress Notes (Signed)
Pt c/o occasional lumbago.

## 2017-11-14 LAB — CERVICOVAGINAL ANCILLARY ONLY
BACTERIAL VAGINITIS: NEGATIVE
Candida vaginitis: NEGATIVE
Chlamydia: NEGATIVE
NEISSERIA GONORRHEA: NEGATIVE
TRICH (WINDOWPATH): NEGATIVE

## 2017-11-14 LAB — CULTURE, GROUP A STREP (THRC)

## 2017-11-14 LAB — VITAMIN D 25 HYDROXY (VIT D DEFICIENCY, FRACTURES): Vit D, 25-Hydroxy: 33.9 ng/mL (ref 30.0–100.0)

## 2017-11-14 LAB — CYTOLOGY - PAP: Diagnosis: NEGATIVE

## 2017-11-16 LAB — CULTURE, OB URINE

## 2017-11-16 LAB — URINE CULTURE, OB REFLEX: ORGANISM ID, BACTERIA: NO GROWTH

## 2017-11-19 LAB — OBSTETRIC PANEL, INCLUDING HIV
Antibody Screen: NEGATIVE
Basophils Absolute: 0 10*3/uL (ref 0.0–0.2)
Basos: 0 %
EOS (ABSOLUTE): 0.1 10*3/uL (ref 0.0–0.4)
EOS: 1 %
HEMOGLOBIN: 12.1 g/dL (ref 11.1–15.9)
HIV Screen 4th Generation wRfx: NONREACTIVE
Hematocrit: 36.7 % (ref 34.0–46.6)
Hepatitis B Surface Ag: NEGATIVE
IMMATURE GRANS (ABS): 0 10*3/uL (ref 0.0–0.1)
IMMATURE GRANULOCYTES: 0 %
LYMPHS: 11 %
Lymphocytes Absolute: 1.3 10*3/uL (ref 0.7–3.1)
MCH: 29.4 pg (ref 26.6–33.0)
MCHC: 33 g/dL (ref 31.5–35.7)
MCV: 89 fL (ref 79–97)
MONOS ABS: 0.6 10*3/uL (ref 0.1–0.9)
Monocytes: 6 %
NEUTROS PCT: 82 %
Neutrophils Absolute: 9 10*3/uL — ABNORMAL HIGH (ref 1.4–7.0)
Platelets: 279 10*3/uL (ref 150–379)
RBC: 4.12 x10E6/uL (ref 3.77–5.28)
RDW: 13.9 % (ref 12.3–15.4)
RH TYPE: POSITIVE
RPR Ser Ql: NONREACTIVE
Rubella Antibodies, IGG: 2.43 index (ref >0.99)
WBC: 10.9 10*3/uL — ABNORMAL HIGH (ref 3.4–10.8)

## 2017-11-19 LAB — HEMOGLOBINOPATHY EVALUATION
HEMOGLOBIN A2 QUANTITATION: 2.8 % (ref 1.8–3.2)
HGB A: 97.2 % (ref 96.4–98.8)
HGB C: 0 %
HGB S: 0 %
HGB VARIANT: 0 %
Hemoglobin F Quantitation: 0 % (ref 0.0–2.0)

## 2017-11-20 LAB — CYSTIC FIBROSIS MUTATION 97: GENE DIS ANAL CARRIER INTERP BLD/T-IMP: NOT DETECTED

## 2017-12-02 ENCOUNTER — Encounter (HOSPITAL_COMMUNITY): Payer: Self-pay | Admitting: Obstetrics

## 2017-12-03 NOTE — L&D Delivery Note (Signed)
Patient is 22 y.o. [id number] 595w2d admitted in active labor. She progressed without augmentation. AROM at 1454.  Prenatal course uncomplicated.  GBS neg  Delivery Note At 7:33 PM a viable female was delivered via Vaginal, Spontaneous (Presentation: LOA).  APGAR: 9, 9; weight pending 1hr skin to skin.  Placenta status: Intact.  Cord: 3V with the following complications: None.  Cord pH: N/A  Anesthesia: Epidural  Episiotomy: None Lacerations: 2nd degree;Perineal Suture Repair: 3.0 vicryl Est. Blood Loss (mL):  750  Mom to postpartum.  Baby to Couplet care / Skin to Skin.  Caryl AdaJazma Magic Mohler, DO 05/04/2018, 8:48 PM OB Fellow Center for [facility], [facility]

## 2017-12-06 ENCOUNTER — Other Ambulatory Visit: Payer: Self-pay | Admitting: Obstetrics

## 2017-12-06 ENCOUNTER — Ambulatory Visit (HOSPITAL_COMMUNITY)
Admission: RE | Admit: 2017-12-06 | Discharge: 2017-12-06 | Disposition: A | Discharge status: Home / Self Care | Payer: Medicaid Other | Source: Ambulatory Visit | Attending: Obstetrics | Admitting: Obstetrics

## 2017-12-06 DIAGNOSIS — Z3689 Encounter for other specified antenatal screening: Secondary | ICD-10-CM | POA: Diagnosis present

## 2017-12-06 DIAGNOSIS — Z3A19 19 weeks gestation of pregnancy: Secondary | ICD-10-CM

## 2017-12-06 DIAGNOSIS — Z3480 Encounter for supervision of other normal pregnancy, unspecified trimester: Secondary | ICD-10-CM

## 2017-12-11 ENCOUNTER — Ambulatory Visit (INDEPENDENT_AMBULATORY_CARE_PROVIDER_SITE_OTHER): Payer: Medicaid Other | Admitting: Obstetrics

## 2017-12-11 ENCOUNTER — Other Ambulatory Visit: Payer: Self-pay

## 2017-12-11 ENCOUNTER — Encounter: Payer: Self-pay | Admitting: Obstetrics

## 2017-12-11 DIAGNOSIS — Z3480 Encounter for supervision of other normal pregnancy, unspecified trimester: Secondary | ICD-10-CM

## 2017-12-11 DIAGNOSIS — Z3482 Encounter for supervision of other normal pregnancy, second trimester: Secondary | ICD-10-CM

## 2017-12-11 NOTE — Progress Notes (Signed)
Subjective:  Lajuana RippleFlor De Sol Donson is a 22 y.o. G3P0020 at 1920w5d being seen today for ongoing prenatal care.  She is currently monitored for the following issues for this low-risk pregnancy and has Supervision of other normal pregnancy, antepartum on their problem list.  Patient reports backache.  Contractions: Not present. Vag. Bleeding: None.  Movement: Present. Denies leaking of fluid.   The following portions of the patient's history were reviewed and updated as appropriate: allergies, current medications, past family history, past medical history, past social history, past surgical history and problem list. Problem list updated.  Objective:   Vitals:   12/11/17 1021  BP: 127/77  Pulse: 90  Weight: 186 lb 11.2 oz (84.7 kg)    Fetal Status:     Movement: Present     General:  Alert, oriented and cooperative. Patient is in no acute distress.  Skin: Skin is warm and dry. No rash noted.   Cardiovascular: Normal heart rate noted  Respiratory: Normal respiratory effort, no problems with respiration noted  Abdomen: Soft, gravid, appropriate for gestational age. Pain/Pressure: Present     Pelvic:  Cervical exam deferred        Extremities: Normal range of motion.  Edema: None  Mental Status: Normal mood and affect. Normal behavior. Normal judgment and thought content.   Urinalysis:      Assessment and Plan:  Pregnancy: G3P0020 at 420w5d  1. Supervision of other normal pregnancy, antepartum   Preterm labor symptoms and general obstetric precautions including but not limited to vaginal bleeding, contractions, leaking of fluid and fetal movement were reviewed in detail with the patient. Please refer to After Visit Summary for other counseling recommendations.  Return in about 4 weeks (around 01/08/2018) for ROB.   Brock BadHarper, Itzamara Casas A, MD

## 2017-12-11 NOTE — Progress Notes (Signed)
Declined AFP 

## 2017-12-23 ENCOUNTER — Encounter: Payer: Self-pay | Admitting: *Deleted

## 2018-01-08 ENCOUNTER — Ambulatory Visit (INDEPENDENT_AMBULATORY_CARE_PROVIDER_SITE_OTHER): Payer: Medicaid Other | Admitting: Obstetrics

## 2018-01-08 ENCOUNTER — Encounter: Payer: Self-pay | Admitting: Obstetrics

## 2018-01-08 DIAGNOSIS — Z3480 Encounter for supervision of other normal pregnancy, unspecified trimester: Secondary | ICD-10-CM

## 2018-01-08 DIAGNOSIS — Z3483 Encounter for supervision of other normal pregnancy, third trimester: Secondary | ICD-10-CM

## 2018-01-08 NOTE — Progress Notes (Signed)
Patient reports good fetal movement, complains of upper back pain at times.

## 2018-01-08 NOTE — Progress Notes (Signed)
Subjective:  Eileen Morris is a 22 y.o. G3P0020 at 3639w5d being seen today for ongoing prenatal care.  She is currently monitored for the following issues for this low-risk pregnancy and has Supervision of other normal pregnancy, antepartum on their problem list.  Patient reports backache.  Contractions: Not present. Vag. Bleeding: None.  Movement: Present. Denies leaking of fluid.   The following portions of the patient's history were reviewed and updated as appropriate: allergies, current medications, past family history, past medical history, past social history, past surgical history and problem list. Problem list updated.  Objective:   Vitals:   01/08/18 1026  BP: 121/71  Pulse: 91  Weight: 191 lb 11.2 oz (87 kg)    Fetal Status:     Movement: Present     General:  Alert, oriented and cooperative. Patient is in no acute distress.  Skin: Skin is warm and dry. No rash noted.   Cardiovascular: Normal heart rate noted  Respiratory: Normal respiratory effort, no problems with respiration noted  Abdomen: Soft, gravid, appropriate for gestational age. Pain/Pressure: Present     Pelvic:  Cervical exam deferred        Extremities: Normal range of motion.  Edema: Trace  Mental Status: Normal mood and affect. Normal behavior. Normal judgment and thought content.   Urinalysis:      Assessment and Plan:  Pregnancy: G3P0020 at 139w5d  1. Supervision of other normal pregnancy, antepartum   Preterm labor symptoms and general obstetric precautions including but not limited to vaginal bleeding, contractions, leaking of fluid and fetal movement were reviewed in detail with the patient. Please refer to After Visit Summary for other counseling recommendations.  Return in about 4 weeks (around 02/05/2018) for ROB.   Brock BadHarper, Charles A, MD

## 2018-01-16 ENCOUNTER — Telehealth: Payer: Self-pay

## 2018-01-16 NOTE — Telephone Encounter (Signed)
Pt called to inform that she went to urgent care this morning and was diagnosed with the flu. Pt was given tamiflu, advised to continue take med and to follow up in our office as scheduled, notified provider.

## 2018-02-05 ENCOUNTER — Ambulatory Visit (INDEPENDENT_AMBULATORY_CARE_PROVIDER_SITE_OTHER): Payer: Medicaid Other | Admitting: Obstetrics

## 2018-02-05 ENCOUNTER — Other Ambulatory Visit: Payer: Self-pay

## 2018-02-05 ENCOUNTER — Encounter: Payer: Self-pay | Admitting: Obstetrics

## 2018-02-05 ENCOUNTER — Other Ambulatory Visit: Payer: Medicaid Other

## 2018-02-05 VITALS — BP 121/74 | HR 97 | Wt 197.7 lb

## 2018-02-05 DIAGNOSIS — Z23 Encounter for immunization: Secondary | ICD-10-CM

## 2018-02-05 DIAGNOSIS — Z3482 Encounter for supervision of other normal pregnancy, second trimester: Secondary | ICD-10-CM

## 2018-02-05 DIAGNOSIS — Z3480 Encounter for supervision of other normal pregnancy, unspecified trimester: Secondary | ICD-10-CM

## 2018-02-05 NOTE — Progress Notes (Signed)
ROB/GTT. TDAP given in right deltoid, tolerated well.  Reports no problems

## 2018-02-05 NOTE — Progress Notes (Signed)
Subjective:  Eileen Morris is a 22 y.o. G3P0020 at 5578w5d being seen today for ongoing prenatal care.  She is currently monitored for the following issues for this low-risk pregnancy and has Supervision of other normal pregnancy, antepartum on their problem list.  Patient reports no complaints.  Contractions: Not present. Vag. Bleeding: None.  Movement: Present. Denies leaking of fluid.   The following portions of the patient's history were reviewed and updated as appropriate: allergies, current medications, past family history, past medical history, past social history, past surgical history and problem list. Problem list updated.  Objective:   Vitals:   02/05/18 0839  BP: 121/74  Pulse: 97  Weight: 197 lb 11.2 oz (89.7 kg)    Fetal Status:     Movement: Present     General:  Alert, oriented and cooperative. Patient is in no acute distress.  Skin: Skin is warm and dry. No rash noted.   Cardiovascular: Normal heart rate noted  Respiratory: Normal respiratory effort, no problems with respiration noted  Abdomen: Soft, gravid, appropriate for gestational age. Pain/Pressure: Absent     Pelvic:  Cervical exam deferred        Extremities: Normal range of motion.  Edema: Trace  Mental Status: Normal mood and affect. Normal behavior. Normal judgment and thought content.   Urinalysis:      Assessment and Plan:  Pregnancy: G3P0020 at 6978w5d  1. Supervision of other normal pregnancy, antepartum Rx: - CBC - Glucose Tolerance, 2 Hours w/1 Hour - HIV antibody - RPR  2. Need for vaccination Rx: - Tdap vaccine greater than or equal to 7yo IM   Preterm labor symptoms and general obstetric precautions including but not limited to vaginal bleeding, contractions, leaking of fluid and fetal movement were reviewed in detail with the patient. Please refer to After Visit Summary for other counseling recommendations.  Return in about 2 weeks (around 02/19/2018) for ROB.   Brock BadHarper, Teresia Myint A,  MD

## 2018-02-06 LAB — GLUCOSE TOLERANCE, 2 HOURS W/ 1HR
GLUCOSE, 2 HOUR: 105 mg/dL (ref 65–152)
GLUCOSE, FASTING: 85 mg/dL (ref 65–91)
Glucose, 1 hour: 167 mg/dL (ref 65–179)

## 2018-02-06 LAB — CBC
Hematocrit: 36.3 % (ref 34.0–46.6)
Hemoglobin: 12.1 g/dL (ref 11.1–15.9)
MCH: 30 pg (ref 26.6–33.0)
MCHC: 33.3 g/dL (ref 31.5–35.7)
MCV: 90 fL (ref 79–97)
PLATELETS: 227 10*3/uL (ref 150–379)
RBC: 4.04 x10E6/uL (ref 3.77–5.28)
RDW: 14.7 % (ref 12.3–15.4)
WBC: 10 10*3/uL (ref 3.4–10.8)

## 2018-02-06 LAB — RPR: RPR: NONREACTIVE

## 2018-02-06 LAB — HIV ANTIBODY (ROUTINE TESTING W REFLEX): HIV SCREEN 4TH GENERATION: NONREACTIVE

## 2018-02-19 ENCOUNTER — Encounter: Payer: Self-pay | Admitting: Obstetrics

## 2018-02-19 ENCOUNTER — Ambulatory Visit (INDEPENDENT_AMBULATORY_CARE_PROVIDER_SITE_OTHER): Payer: Medicaid Other | Admitting: Obstetrics

## 2018-02-19 VITALS — BP 122/77 | HR 98 | Wt 201.2 lb

## 2018-02-19 DIAGNOSIS — J301 Allergic rhinitis due to pollen: Secondary | ICD-10-CM

## 2018-02-19 DIAGNOSIS — O3663X0 Maternal care for excessive fetal growth, third trimester, not applicable or unspecified: Secondary | ICD-10-CM

## 2018-02-19 DIAGNOSIS — Z3483 Encounter for supervision of other normal pregnancy, third trimester: Secondary | ICD-10-CM

## 2018-02-19 DIAGNOSIS — Z3480 Encounter for supervision of other normal pregnancy, unspecified trimester: Secondary | ICD-10-CM

## 2018-02-19 DIAGNOSIS — R12 Heartburn: Secondary | ICD-10-CM

## 2018-02-19 DIAGNOSIS — O26899 Other specified pregnancy related conditions, unspecified trimester: Secondary | ICD-10-CM

## 2018-02-19 DIAGNOSIS — O26893 Other specified pregnancy related conditions, third trimester: Secondary | ICD-10-CM

## 2018-02-19 DIAGNOSIS — Z9109 Other allergy status, other than to drugs and biological substances: Secondary | ICD-10-CM

## 2018-02-19 MED ORDER — LORATADINE 10 MG PO TABS: 10.0000 mg | ORAL_TABLET | Freq: Every day | ORAL | 11 refills | Status: DC

## 2018-02-19 MED ORDER — RANITIDINE HCL 150 MG PO TABS: 150.0000 mg | ORAL_TABLET | Freq: Two times a day (BID) | ORAL | 5 refills | Status: DC

## 2018-02-19 NOTE — Progress Notes (Signed)
Patient reports good fetal movement, denies pain. 

## 2018-02-19 NOTE — Progress Notes (Signed)
Subjective:  Eileen Morris is a 22 y.o. G3P0020 at 1181w5d being seen today for ongoing prenatal care.  She is currently monitored for the following issues for this low-risk pregnancy and has Supervision of other normal pregnancy, antepartum on their problem list.  Patient reports heartburn and itching and burning of eyes.  Contractions: Not present. Vag. Bleeding: None.  Movement: Present. Denies leaking of fluid.   The following portions of the patient's history were reviewed and updated as appropriate: allergies, current medications, past family history, past medical history, past social history, past surgical history and problem list. Problem list updated.  Objective:   Vitals:   02/19/18 1043  BP: 122/77  Pulse: 98  Weight: 201 lb 3.2 oz (91.3 kg)    Fetal Status: Fetal Heart Rate (bpm): 150   Movement: Present     General:  Alert, oriented and cooperative. Patient is in no acute distress.  Skin: Skin is warm and dry. No rash noted.   Cardiovascular: Normal heart rate noted  Respiratory: Normal respiratory effort, no problems with respiration noted  Abdomen: Soft, gravid, appropriate for gestational age. Pain/Pressure: Absent     Pelvic:  Cervical exam deferred        Extremities: Normal range of motion.  Edema: Trace  Mental Status: Normal mood and affect. Normal behavior. Normal judgment and thought content.   Urinalysis:      Assessment and Plan:  Pregnancy: G3P0020 at 7381w5d  1. Supervision of other normal pregnancy, antepartum  2. Excessive fetal growth affecting management of pregnancy in third trimester, single or unspecified fetus Rx: - US MFM OB COMP + 14 WK; Future - US MFM OB FOLLOW UP; Future  3. Allergy to pollen Rx: - loratadine (CLARITIN) 10 MG tablet; Take 1 tablet (10 mg total) by mouth daily.  Dispense: 30 tablet; Refill: 11  4. Heartburn during pregnancy, antepartum Rx: - ranitidine (ZANTAC) 150 MG tablet; Take 1 tablet (150 mg total) by mouth 2  (two) times daily.  Dispense: 60 tablet; Refill: 5  Preterm labor symptoms and general obstetric precautions including but not limited to vaginal bleeding, contractions, leaking of fluid and fetal movement were reviewed in detail with the patient. Please refer to After Visit Summary for other counseling recommendations.  Return in about 2 weeks (around 03/05/2018) for ROB.   Brock BadHarper, Charles A, MD

## 2018-03-05 ENCOUNTER — Other Ambulatory Visit: Payer: Self-pay | Admitting: Obstetrics

## 2018-03-05 ENCOUNTER — Ambulatory Visit (HOSPITAL_COMMUNITY)
Admission: RE | Admit: 2018-03-05 | Discharge: 2018-03-05 | Disposition: A | Discharge status: Home / Self Care | Payer: Medicaid Other | Source: Ambulatory Visit | Attending: Obstetrics | Admitting: Obstetrics

## 2018-03-05 ENCOUNTER — Ambulatory Visit (INDEPENDENT_AMBULATORY_CARE_PROVIDER_SITE_OTHER): Payer: Medicaid Other | Admitting: Obstetrics

## 2018-03-05 ENCOUNTER — Encounter: Payer: Self-pay | Admitting: Obstetrics

## 2018-03-05 VITALS — BP 129/77 | HR 107 | Wt 205.8 lb

## 2018-03-05 DIAGNOSIS — O3663X0 Maternal care for excessive fetal growth, third trimester, not applicable or unspecified: Secondary | ICD-10-CM | POA: Diagnosis present

## 2018-03-05 DIAGNOSIS — Z362 Encounter for other antenatal screening follow-up: Secondary | ICD-10-CM

## 2018-03-05 DIAGNOSIS — Z3480 Encounter for supervision of other normal pregnancy, unspecified trimester: Secondary | ICD-10-CM

## 2018-03-05 DIAGNOSIS — Z3A31 31 weeks gestation of pregnancy: Secondary | ICD-10-CM | POA: Insufficient documentation

## 2018-03-05 DIAGNOSIS — Z3483 Encounter for supervision of other normal pregnancy, third trimester: Secondary | ICD-10-CM

## 2018-03-05 NOTE — Progress Notes (Signed)
Subjective:  Eileen Morris is a 22 y.o. G3P0020 at [redacted]w[redacted]d being seen today for ongoing prenatal care.  She is currently monitored for the following issues for this low-risk pregnancy and has Supervision of other normal pregnancy, antepartum on their problem list.  Patient reports no complaints.  Contractions: Not present. Vag. Bleeding: None.  Movement: Present. Denies leaking of fluid.   The following portions of the patient's history were reviewed and updated as appropriate: allergies, current medications, past family history, past medical history, past social history, past surgical history and problem list. Problem list updated.  Objective:   Vitals:   03/05/18 1039  BP: 129/77  Pulse: (!) 107  Weight: 205 lb 12.8 oz (93.4 kg)    Fetal Status: Fetal Heart Rate (bpm): 150   Movement: Present     General:  Alert, oriented and cooperative. Patient is in no acute distress.  Skin: Skin is warm and dry. No rash noted.   Cardiovascular: Normal heart rate noted  Respiratory: Normal respiratory effort, no problems with respiration noted  Abdomen: Soft, gravid, appropriate for gestational age. Pain/Pressure: Present     Pelvic:  Cervical exam deferred        Extremities: Normal range of motion.  Edema: Trace  Mental Status: Normal mood and affect. Normal behavior. Normal judgment and thought content.   Urinalysis:       Assessment and Plan:  Pregnancy: G3P0020 at [redacted]w[redacted]d  1. Supervision of other normal pregnancy, antepartum   2. Excessive fetal growth affecting management of pregnancy in third trimester, single or unspecified fetus  [place] MFM OB FOLLOW UP (Accession [id number]) (Order [id number])  Imaging  Date: 03/05/2018 Department: [facility] MATERNAL FETAL CARE ULTRASOUND Released By: Genevie Cheshire, RT Authorizing: Brock Bad, MD  Exam Information   Status Exam Begun  Exam Ended   Final [99] 03/05/2018 7:59 AM 03/05/2018 8:33 AM  PACS Images   Show images for  [place] MFM OB FOLLOW UP  Study Result   ----------------------------------------------------------------------  OBSTETRICS REPORT                      (Signed Final 03/05/2018 08:36 am) ---------------------------------------------------------------------- Patient Info  ID #:       [id number]                          D.O.B.:  Feb 03, 1996 (21 yrs)  Name:       Eileen Morris               Visit Date: 03/05/2018 08:00 am ---------------------------------------------------------------------- Performed By  Performed By:     Percell Boston          Ref. Address:     [address]                    [address]                                                             [address]  [place], [place]                                                             [address]  Attending:        Charlsie Merles MD         Location:         [facility]  Referred By:      Brock Bad MD ---------------------------------------------------------------------- Orders   #  Description                                 Code   1  [place] MFM OB FOLLOW UP                         [phone]  ----------------------------------------------------------------------   #  Ordered By               Order #        Accession #    Episode #   1  Coral Ceo           [id number]      [id number]     [id number]  ---------------------------------------------------------------------- Indications   [redacted] weeks gestation of pregnancy                Z3A.31   Encounter for other antenatal screening        Z36.2   follow-up   Large for gestational age fetus affecting      O36.60X0   management of mother  ---------------------------------------------------------------------- OB History  Gravidity:    3          SAB:   2  Living:       0 ---------------------------------------------------------------------- Fetal Evaluation  Num Of Fetuses:     1  Fetal Heart          143  Rate(bpm):  Cardiac Activity:   Observed  Presentation:       Cephalic  Placenta:           Posterior, above cervical os  P. Cord Insertion:  Visualized, central  Amniotic Fluid  AFI FV:      Subjectively within normal limits  AFI Sum(cm)     %Tile       Largest Pocket(cm)  14.47           50          4.82  RUQ(cm)       RLQ(cm)       LUQ(cm)        LLQ(cm)  4.82          4.5           3.65           1.5 ---------------------------------------------------------------------- Biometry  BPD:      80.7  mm     G. Age:  32w 3d         62  %    CI:        75.96   %    70 - 86  FL/HC:      21.1   %    19.1 - 21.3  HC:      293.5  mm     G. Age:  32w 3d         31  %    HC/AC:      1.02        0.96 - 1.17  AC:      287.4  mm     G. Age:  32w 5d         77  %    FL/BPD:     76.8   %    71 - 87  FL:         62  mm     G. Age:  32w 1d         49  %    FL/AC:      21.6   %    20 - 24  HUM:        53  mm     G. Age:  30w 6d         34  %  Est. FW:    1988  gm      4 lb 6 oz     71  % ---------------------------------------------------------------------- Gestational Age  LMP:           31w 5d        Date:  07/26/17                 EDD:   05/02/18  U/S Today:     32w 3d                                        EDD:   04/27/18  Best:          31w 5d     Det. By:  LMP  (07/26/17)          EDD:   05/02/18 ---------------------------------------------------------------------- Anatomy  Cranium:               Appears normal         Aortic Arch:            Previously seen  Cavum:                 Previously seen        Ductal Arch:            Previously seen  Ventricles:            Appears normal         Diaphragm:              Previously seen  Choroid Plexus:        Previously seen        Stomach:                Appears normal, left                                                                        sided  Cerebellum:  Previously seen         Abdomen:                Previously seen  Posterior Fossa:       Previously seen        Abdominal Wall:         Previously seen  Nuchal Fold:           Previously seen        Cord Vessels:           Previously seen  Face:                  Orbits and profile     Kidneys:                Appear normal                         previously seen  Lips:                  Previously seen        Bladder:                Appears normal  Thoracic:              Appears normal         Spine:                  Previously seen  Heart:                 Appears normal         Upper Extremities:      Previously seen                         (4CH, axis, and situs  RVOT:                  Appears normal         Lower Extremities:      Previously seen  LVOT:                  Appears normal  Other:  Fetus appears to be a female. Heels and 5th digit visualized previously. ---------------------------------------------------------------------- Cervix Uterus Adnexa  Cervix  Not visualized (advanced GA >29wks)  Uterus  No abnormality visualized.  Left Ovary  No adnexal mass visualized.  Right Ovary  No adnexal mass visualized.  Cul De Sac:   No free fluid seen.  Adnexa:       No abnormality visualized. ---------------------------------------------------------------------- Impression  Singleton intrauterine pregnancy at 31+5 weeks with  suspected LGA fetus here for growth evaluation  Interval review of the anatomy shows no sonographic  markers for aneuploidy or structural anomalies  All relevant fetal anatomy has been visualized  Amniotic fluid volume is normal  Estimated fetal weight shows growth in the 71st percentile;  abdominal circumference is in the 77th percentile ---------------------------------------------------------------------- Recommendations  Follow-up ultrasounds as clinically indicated. ----------------------------------------------------------------------                 Charlsie MerlesMark Newman,  MD Electronically Signed Final Report   03/05/2018 08:36 am ----------------------------------------------------------------------   Preterm labor symptoms and general obstetric precautions including but not limited to vaginal bleeding, contractions, leaking of fluid and fetal movement were reviewed in detail with the patient. Please refer to After Visit Summary for other counseling recommendations.  Return in about 2 weeks (  around 03/19/2018) for ROB.   Brock Bad, MD

## 2018-03-14 ENCOUNTER — Ambulatory Visit (HOSPITAL_COMMUNITY): Payer: Medicaid Other

## 2018-03-19 ENCOUNTER — Ambulatory Visit (INDEPENDENT_AMBULATORY_CARE_PROVIDER_SITE_OTHER): Payer: Medicaid Other | Admitting: Obstetrics

## 2018-03-19 ENCOUNTER — Encounter: Payer: Self-pay | Admitting: Obstetrics

## 2018-03-19 VITALS — BP 123/77 | HR 95 | Wt 207.0 lb

## 2018-03-19 DIAGNOSIS — Z3480 Encounter for supervision of other normal pregnancy, unspecified trimester: Secondary | ICD-10-CM

## 2018-03-19 DIAGNOSIS — Z3483 Encounter for supervision of other normal pregnancy, third trimester: Secondary | ICD-10-CM

## 2018-03-19 NOTE — Progress Notes (Signed)
Subjective:  Eileen Morris is a 22 y.o. G3P0020 at 2443w5d being seen today for ongoing prenatal care.  She is currently monitored for the following issues for this low-risk pregnancy and has Supervision of other normal pregnancy, antepartum on their problem list.  Patient reports no complaints.  Contractions: Not present. Vag. Bleeding: None.  Movement: Present. Denies leaking of fluid.   The following portions of the patient's history were reviewed and updated as appropriate: allergies, current medications, past family history, past medical history, past social history, past surgical history and problem list. Problem list updated.  Objective:   Vitals:   03/19/18 1022  BP: 123/77  Pulse: 95  Weight: 207 lb (93.9 kg)    Fetal Status: Fetal Heart Rate (bpm): 150   Movement: Present     General:  Alert, oriented and cooperative. Patient is in no acute distress.  Skin: Skin is warm and dry. No rash noted.   Cardiovascular: Normal heart rate noted  Respiratory: Normal respiratory effort, no problems with respiration noted  Abdomen: Soft, gravid, appropriate for gestational age. Pain/Pressure: Present     Pelvic:  Cervical exam deferred        Extremities: Normal range of motion.     Mental Status: Normal mood and affect. Normal behavior. Normal judgment and thought content.   Urinalysis:      Assessment and Plan:  Pregnancy: G3P0020 at 3543w5d  1. Supervision of other normal pregnancy, antepartum   Preterm labor symptoms and general obstetric precautions including but not limited to vaginal bleeding, contractions, leaking of fluid and fetal movement were reviewed in detail with the patient. Please refer to After Visit Summary for other counseling recommendations.  Return in about 2 weeks (around 04/02/2018) for ROB.   Brock BadHarper, Mitra Duling A, MD

## 2018-03-26 ENCOUNTER — Encounter: Payer: Medicaid Other | Admitting: Obstetrics

## 2018-04-02 ENCOUNTER — Ambulatory Visit (INDEPENDENT_AMBULATORY_CARE_PROVIDER_SITE_OTHER): Payer: Medicaid Other | Admitting: Obstetrics

## 2018-04-02 ENCOUNTER — Encounter: Payer: Self-pay | Admitting: Obstetrics

## 2018-04-02 VITALS — BP 117/73 | HR 93 | Temp 97.9°F | Wt 213.6 lb

## 2018-04-02 DIAGNOSIS — Z3480 Encounter for supervision of other normal pregnancy, unspecified trimester: Secondary | ICD-10-CM

## 2018-04-02 NOTE — Progress Notes (Signed)
Subjective:  Eileen Morris is a 22 y.o. G3P0020 at [redacted]w[redacted]d being seen today for ongoing prenatal care.  She is currently monitored for the following issues for this low-risk pregnancy and has Supervision of other normal pregnancy, antepartum on their problem list.  Patient reports no complaints.  Contractions: Not present. Vag. Bleeding: None.  Movement: Present. Denies leaking of fluid.   The following portions of the patient's history were reviewed and updated as appropriate: allergies, current medications, past family history, past medical history, past social history, past surgical history and problem list. Problem list updated.  Objective:   Vitals:   04/02/18 0946  BP: 117/73  Pulse: 93  Temp: 97.9 F (36.6 C)  Weight: 213 lb 9.6 oz (96.9 kg)    Fetal Status: Fetal Heart Rate (bpm): 150   Movement: Present     General:  Alert, oriented and cooperative. Patient is in no acute distress.  Skin: Skin is warm and dry. No rash noted.   Cardiovascular: Normal heart rate noted  Respiratory: Normal respiratory effort, no problems with respiration noted  Abdomen: Soft, gravid, appropriate for gestational age. Pain/Pressure: Present     Pelvic:  Cervical exam deferred        Extremities: Normal range of motion.  Edema: Trace  Mental Status: Normal mood and affect. Normal behavior. Normal judgment and thought content.   Urinalysis:      Assessment and Plan:  Pregnancy: G3P0020 at [redacted]w[redacted]d  1. Supervision of other normal pregnancy, antepartum   Preterm labor symptoms and general obstetric precautions including but not limited to vaginal bleeding, contractions, leaking of fluid and fetal movement were reviewed in detail with the patient. Please refer to After Visit Summary for other counseling recommendations.  Return in about 1 week (around 04/09/2018) for ROB.   Brock Bad, MD

## 2018-04-09 ENCOUNTER — Other Ambulatory Visit (HOSPITAL_COMMUNITY)
Admission: RE | Admit: 2018-04-09 | Discharge: 2018-04-09 | Disposition: A | Discharge status: Home / Self Care | Payer: Medicaid Other | Source: Ambulatory Visit | Attending: Obstetrics | Admitting: Obstetrics

## 2018-04-09 ENCOUNTER — Ambulatory Visit (INDEPENDENT_AMBULATORY_CARE_PROVIDER_SITE_OTHER): Payer: Medicaid Other | Admitting: Obstetrics

## 2018-04-09 ENCOUNTER — Encounter: Payer: Self-pay | Admitting: Obstetrics

## 2018-04-09 VITALS — BP 128/79 | HR 99 | Wt 216.0 lb

## 2018-04-09 DIAGNOSIS — Z3480 Encounter for supervision of other normal pregnancy, unspecified trimester: Secondary | ICD-10-CM

## 2018-04-09 DIAGNOSIS — Z3483 Encounter for supervision of other normal pregnancy, third trimester: Secondary | ICD-10-CM

## 2018-04-09 LAB — OB RESULTS CONSOLE GC/CHLAMYDIA: GC PROBE AMP, GENITAL: NEGATIVE

## 2018-04-09 NOTE — Progress Notes (Signed)
Subjective:  Eileen Morris is a 22 y.o. G3P0020 at [redacted]w[redacted]d being seen today for ongoing prenatal care.  She is currently monitored for the following issues for this low-risk pregnancy and has Supervision of other normal pregnancy, antepartum on their problem list.  Patient reports no complaints.  Contractions: Not present. Vag. Bleeding: None.  Movement: Present. Denies leaking of fluid.   The following portions of the patient's history were reviewed and updated as appropriate: allergies, current medications, past family history, past medical history, past social history, past surgical history and problem list. Problem list updated.  Objective:   Vitals:   04/09/18 1114  BP: 128/79  Pulse: 99  Weight: 216 lb (98 kg)    Fetal Status: Fetal Heart Rate (bpm): 150   Movement: Present     General:  Alert, oriented and cooperative. Patient is in no acute distress.  Skin: Skin is warm and dry. No rash noted.   Cardiovascular: Normal heart rate noted  Respiratory: Normal respiratory effort, no problems with respiration noted  Abdomen: Soft, gravid, appropriate for gestational age. Pain/Pressure: Present     Pelvic:  Cervical exam deferred        Extremities: Normal range of motion.  Edema: None  Mental Status: Normal mood and affect. Normal behavior. Normal judgment and thought content.   Urinalysis:      Assessment and Plan:  Pregnancy: G3P0020 at [redacted]w[redacted]d  1. Supervision of other normal pregnancy, antepartum Rx: - Strep Gp B NAA - Cervicovaginal ancillary only  Term labor symptoms and general obstetric precautions including but not limited to vaginal bleeding, contractions, leaking of fluid and fetal movement were reviewed in detail with the patient. Please refer to After Visit Summary for other counseling recommendations.  Return in about 1 week (around 04/16/2018) for ROB.   Brock Bad, MD

## 2018-04-10 LAB — CERVICOVAGINAL ANCILLARY ONLY
Bacterial vaginitis: NEGATIVE
CANDIDA VAGINITIS: NEGATIVE
Chlamydia: NEGATIVE
Neisseria Gonorrhea: NEGATIVE
Trichomonas: NEGATIVE

## 2018-04-11 LAB — STREP GP B NAA: STREP GROUP B AG: NEGATIVE

## 2018-04-16 ENCOUNTER — Ambulatory Visit (INDEPENDENT_AMBULATORY_CARE_PROVIDER_SITE_OTHER): Payer: Medicaid Other | Admitting: Obstetrics

## 2018-04-16 ENCOUNTER — Encounter: Payer: Self-pay | Admitting: Obstetrics

## 2018-04-16 VITALS — BP 139/80 | HR 106 | Wt 218.0 lb

## 2018-04-16 DIAGNOSIS — Z3480 Encounter for supervision of other normal pregnancy, unspecified trimester: Secondary | ICD-10-CM

## 2018-04-16 DIAGNOSIS — Z3483 Encounter for supervision of other normal pregnancy, third trimester: Secondary | ICD-10-CM

## 2018-04-16 NOTE — Progress Notes (Signed)
Subjective:  Eileen Morris is a 22 y.o. G3P0020 at [redacted]w[redacted]d being seen today for ongoing prenatal care.  She is currently monitored for the following issues for this low-risk pregnancy and has Supervision of other normal pregnancy, antepartum on their problem list.  Patient reports no complaints.  Contractions: Not present. Vag. Bleeding: None.  Movement: Present. Denies leaking of fluid.   The following portions of the patient's history were reviewed and updated as appropriate: allergies, current medications, past family history, past medical history, past social history, past surgical history and problem list. Problem list updated.  Objective:   Vitals:   04/16/18 1048  BP: 139/80  Pulse: (!) 106  Weight: 218 lb (98.9 kg)    Fetal Status: Fetal Heart Rate (bpm): 150   Movement: Present     General:  Alert, oriented and cooperative. Patient is in no acute distress.  Skin: Skin is warm and dry. No rash noted.   Cardiovascular: Normal heart rate noted  Respiratory: Normal respiratory effort, no problems with respiration noted  Abdomen: Soft, gravid, appropriate for gestational age. Pain/Pressure: Present     Pelvic:  Cervical exam deferred        Extremities: Normal range of motion.  Edema: Trace  Mental Status: Normal mood and affect. Normal behavior. Normal judgment and thought content.   Urinalysis:      Assessment and Plan:  Pregnancy: G3P0020 at [redacted]w[redacted]d  1. Supervision of other normal pregnancy, antepartum   Term labor symptoms and general obstetric precautions including but not limited to vaginal bleeding, contractions, leaking of fluid and fetal movement were reviewed in detail with the patient. Please refer to After Visit Summary for other counseling recommendations.  Return in about 1 week (around 04/23/2018) for ROB.   Brock Bad, MD

## 2018-04-23 ENCOUNTER — Ambulatory Visit (INDEPENDENT_AMBULATORY_CARE_PROVIDER_SITE_OTHER): Payer: Medicaid Other | Admitting: Obstetrics

## 2018-04-23 ENCOUNTER — Encounter: Payer: Self-pay | Admitting: Obstetrics

## 2018-04-23 VITALS — BP 132/82 | HR 105 | Wt 219.4 lb

## 2018-04-23 DIAGNOSIS — O3663X0 Maternal care for excessive fetal growth, third trimester, not applicable or unspecified: Secondary | ICD-10-CM

## 2018-04-23 DIAGNOSIS — Z3480 Encounter for supervision of other normal pregnancy, unspecified trimester: Secondary | ICD-10-CM

## 2018-04-23 DIAGNOSIS — Z3483 Encounter for supervision of other normal pregnancy, third trimester: Secondary | ICD-10-CM

## 2018-04-23 NOTE — Progress Notes (Signed)
Patient reports good fetal movement with some pressure. 

## 2018-04-23 NOTE — Progress Notes (Signed)
Subjective:  Eileen Morris is a 22 y.o. G3P0020 at [redacted]w[redacted]d being seen today for ongoing prenatal care.  She is currently monitored for the following issues for this low-risk pregnancy and has Supervision of other normal pregnancy, antepartum on their problem list.  Patient reports no complaints.  Contractions: Not present. Vag. Bleeding: None.  Movement: Present. Denies leaking of fluid.   The following portions of the patient's history were reviewed and updated as appropriate: allergies, current medications, past family history, past medical history, past social history, past surgical history and problem list. Problem list updated.  Objective:   Vitals:   04/23/18 1345  BP: 132/82  Pulse: (!) 105  Weight: 219 lb 6.4 oz (99.5 kg)    Fetal Status: Fetal Heart Rate (bpm): 150   Movement: Present     General:  Alert, oriented and cooperative. Patient is in no acute distress.  Skin: Skin is warm and dry. No rash noted.   Cardiovascular: Normal heart rate noted  Respiratory: Normal respiratory effort, no problems with respiration noted  Abdomen: Soft, gravid, appropriate for gestational age. Pain/Pressure: Present     Pelvic:  Cervical exam deferred        Extremities: Normal range of motion.  Edema: Trace  Mental Status: Normal mood and affect. Normal behavior. Normal judgment and thought content.   Urinalysis:      Assessment and Plan:  Pregnancy: G3P0020 at [redacted]w[redacted]d  1. Supervision of other normal pregnancy, antepartum  2. Excessive fetal growth affecting management of pregnancy in third trimester, single or unspecified fetus - EFW at 77th percentile at 31 weeks   Term labor symptoms and general obstetric precautions including but not limited to vaginal bleeding, contractions, leaking of fluid and fetal movement were reviewed in detail with the patient. Please refer to After Visit Summary for other counseling recommendations.  Return in about 1 week (around 04/30/2018) for  ROB.   Brock Bad, MD

## 2018-04-30 ENCOUNTER — Telehealth (HOSPITAL_COMMUNITY): Payer: Self-pay | Admitting: *Deleted

## 2018-04-30 ENCOUNTER — Encounter (HOSPITAL_COMMUNITY): Payer: Self-pay | Admitting: *Deleted

## 2018-04-30 ENCOUNTER — Encounter: Payer: Self-pay | Admitting: Obstetrics

## 2018-04-30 ENCOUNTER — Ambulatory Visit (INDEPENDENT_AMBULATORY_CARE_PROVIDER_SITE_OTHER): Payer: Medicaid Other | Admitting: Obstetrics

## 2018-04-30 VITALS — BP 130/80 | HR 97 | Wt 222.2 lb

## 2018-04-30 DIAGNOSIS — O3663X0 Maternal care for excessive fetal growth, third trimester, not applicable or unspecified: Secondary | ICD-10-CM

## 2018-04-30 DIAGNOSIS — Z3480 Encounter for supervision of other normal pregnancy, unspecified trimester: Secondary | ICD-10-CM

## 2018-04-30 NOTE — Telephone Encounter (Signed)
Preadmission screen  

## 2018-04-30 NOTE — Progress Notes (Signed)
Subjective:  Eileen Morris is a 22 y.o. G3P0020 at [redacted]w[redacted]d being seen today for ongoing prenatal care.  She is currently monitored for the following issues for this low-risk pregnancy and has Supervision of other normal pregnancy, antepartum on their problem list.  Patient reports occasional contractions.  Contractions: Irregular. Vag. Bleeding: None.  Movement: Present. Denies leaking of fluid.   The following portions of the patient's history were reviewed and updated as appropriate: allergies, current medications, past family history, past medical history, past social history, past surgical history and problem list. Problem list updated.  Objective:   Vitals:   04/30/18 1002  BP: 130/80  Pulse: 97  Weight: 222 lb 3.2 oz (100.8 kg)    Fetal Status: Fetal Heart Rate (bpm): 150   Movement: Present     General:  Alert, oriented and cooperative. Patient is in no acute distress.  Skin: Skin is warm and dry. No rash noted.   Cardiovascular: Normal heart rate noted  Respiratory: Normal respiratory effort, no problems with respiration noted  Abdomen: Soft, gravid, appropriate for gestational age. Pain/Pressure: Present     Pelvic:  Cervical exam performed      2CM / 70% / -1 / Vtx  Extremities: Normal range of motion.  Edema: Moderate pitting, indentation subsides rapidly  Mental Status: Normal mood and affect. Normal behavior. Normal judgment and thought content.   Urinalysis:      Assessment and Plan:  Pregnancy: G3P0020 at [redacted]w[redacted]d  1. Supervision of other normal pregnancy, antepartum - doing well  2. Excessive fetal growth affecting management of pregnancy in third trimester, single or unspecified fetus   Term labor symptoms and general obstetric precautions including but not limited to vaginal bleeding, contractions, leaking of fluid and fetal movement were reviewed in detail with the patient. Please refer to After Visit Summary for other counseling recommendations.  Return in  about 1 week (around 05/07/2018) for ROB.   Brock Bad, MD

## 2018-05-04 ENCOUNTER — Inpatient Hospital Stay (HOSPITAL_COMMUNITY): Payer: Medicaid Other | Admitting: Anesthesiology

## 2018-05-04 ENCOUNTER — Other Ambulatory Visit: Payer: Self-pay

## 2018-05-04 ENCOUNTER — Encounter (HOSPITAL_COMMUNITY): Payer: Self-pay

## 2018-05-04 ENCOUNTER — Inpatient Hospital Stay (HOSPITAL_COMMUNITY)
Admission: AD | Admit: 2018-05-04 | Discharge: 2018-05-06 | DRG: 807 | Disposition: A | Discharge status: Home / Self Care | Payer: Medicaid Other | Attending: Family Medicine | Admitting: Family Medicine

## 2018-05-04 DIAGNOSIS — O26893 Other specified pregnancy related conditions, third trimester: Secondary | ICD-10-CM | POA: Diagnosis present

## 2018-05-04 DIAGNOSIS — O480 Post-term pregnancy: Secondary | ICD-10-CM | POA: Diagnosis not present

## 2018-05-04 DIAGNOSIS — R109 Unspecified abdominal pain: Secondary | ICD-10-CM

## 2018-05-04 DIAGNOSIS — Z3483 Encounter for supervision of other normal pregnancy, third trimester: Secondary | ICD-10-CM | POA: Diagnosis present

## 2018-05-04 DIAGNOSIS — Z3A40 40 weeks gestation of pregnancy: Secondary | ICD-10-CM

## 2018-05-04 LAB — CBC
HCT: 38.9 % (ref 36.0–46.0)
HEMOGLOBIN: 13.3 g/dL (ref 12.0–15.0)
MCH: 31 pg (ref 26.0–34.0)
MCHC: 34.2 g/dL (ref 30.0–36.0)
MCV: 90.7 fL (ref 78.0–100.0)
PLATELETS: 188 10*3/uL (ref 150–400)
RBC: 4.29 MIL/uL (ref 3.87–5.11)
RDW: 14 % (ref 11.5–15.5)
WBC: 11.6 10*3/uL — ABNORMAL HIGH (ref 4.0–10.5)

## 2018-05-04 LAB — TYPE AND SCREEN
ABO/RH(D): A POS
ANTIBODY SCREEN: NEGATIVE

## 2018-05-04 MED ORDER — LIDOCAINE HCL (PF) 1 % IJ SOLN
INTRAMUSCULAR | Status: DC | PRN
  Administered 2018-05-04 (×2): 4 mL via EPIDURAL

## 2018-05-04 MED ORDER — PRENATAL MULTIVITAMIN CH: 1.0000 | ORAL_TABLET | Freq: Every day | ORAL | Status: DC

## 2018-05-04 MED ORDER — ONDANSETRON HCL 4 MG/2ML IJ SOLN: 4.0000 mg | INTRAMUSCULAR | Status: DC | PRN

## 2018-05-04 MED ORDER — SODIUM CHLORIDE 0.9% FLUSH: 3.0000 mL | INTRAVENOUS | Status: DC | PRN

## 2018-05-04 MED ORDER — DIPHENHYDRAMINE HCL 25 MG PO CAPS: 25.0000 mg | ORAL_CAPSULE | Freq: Four times a day (QID) | ORAL | Status: DC | PRN

## 2018-05-04 MED ORDER — OXYCODONE HCL 5 MG PO TABS: 10.0000 mg | ORAL_TABLET | ORAL | Status: DC | PRN

## 2018-05-04 MED ORDER — LIDOCAINE HCL (PF) 1 % IJ SOLN
30.0000 mL | INTRAMUSCULAR | Status: DC | PRN
  Filled 2018-05-04: qty 30

## 2018-05-04 MED ORDER — SODIUM CHLORIDE 0.9% FLUSH: 3.0000 mL | Freq: Two times a day (BID) | INTRAVENOUS | Status: DC

## 2018-05-04 MED ORDER — ACETAMINOPHEN 325 MG PO TABS
650.0000 mg | ORAL_TABLET | ORAL | Status: DC | PRN
  Administered 2018-05-05: 650 mg via ORAL
  Filled 2018-05-04: qty 2

## 2018-05-04 MED ORDER — COCONUT OIL OIL: 1.0000 "application " | TOPICAL_OIL | Status: DC | PRN

## 2018-05-04 MED ORDER — EPHEDRINE 5 MG/ML INJ: 10.0000 mg | INTRAVENOUS | Status: DC | PRN

## 2018-05-04 MED ORDER — OXYTOCIN 40 UNITS IN LACTATED RINGERS INFUSION - SIMPLE MED
2.5000 [IU]/h | INTRAVENOUS | Status: DC
  Filled 2018-05-04: qty 1000

## 2018-05-04 MED ORDER — OXYTOCIN 10 UNIT/ML IJ SOLN: 10.0000 [IU] | Freq: Once | INTRAMUSCULAR | Status: DC

## 2018-05-04 MED ORDER — LACTATED RINGERS IV SOLN
INTRAVENOUS | Status: DC
  Administered 2018-05-04 (×2): via INTRAVENOUS

## 2018-05-04 MED ORDER — OXYCODONE HCL 5 MG PO TABS: 5.0000 mg | ORAL_TABLET | ORAL | Status: DC | PRN

## 2018-05-04 MED ORDER — FENTANYL 2.5 MCG/ML BUPIVACAINE 1/10 % EPIDURAL INFUSION (WH - ANES): 14.0000 mL/h | INTRAMUSCULAR | Status: DC | PRN

## 2018-05-04 MED ORDER — IBUPROFEN 600 MG PO TABS
600.0000 mg | ORAL_TABLET | Freq: Four times a day (QID) | ORAL | Status: DC
  Administered 2018-05-04 – 2018-05-06 (×7): 600 mg via ORAL
  Filled 2018-05-04 (×7): qty 1

## 2018-05-04 MED ORDER — ACETAMINOPHEN 325 MG PO TABS: 650.0000 mg | ORAL_TABLET | ORAL | Status: DC | PRN

## 2018-05-04 MED ORDER — DIBUCAINE 1 % RE OINT: 1.0000 "application " | TOPICAL_OINTMENT | RECTAL | Status: DC | PRN

## 2018-05-04 MED ORDER — SODIUM CHLORIDE 0.9 % IV SOLN: 250.0000 mL | INTRAVENOUS | Status: DC | PRN

## 2018-05-04 MED ORDER — SENNOSIDES-DOCUSATE SODIUM 8.6-50 MG PO TABS
2.0000 | ORAL_TABLET | ORAL | Status: DC
  Administered 2018-05-05 – 2018-05-06 (×2): 2 via ORAL
  Filled 2018-05-04 (×2): qty 2

## 2018-05-04 MED ORDER — ONDANSETRON HCL 4 MG PO TABS: 4.0000 mg | ORAL_TABLET | ORAL | Status: DC | PRN

## 2018-05-04 MED ORDER — OXYCODONE-ACETAMINOPHEN 5-325 MG PO TABS: 2.0000 | ORAL_TABLET | ORAL | Status: DC | PRN

## 2018-05-04 MED ORDER — WITCH HAZEL-GLYCERIN EX PADS: 1.0000 "application " | MEDICATED_PAD | CUTANEOUS | Status: DC | PRN

## 2018-05-04 MED ORDER — SOD CITRATE-CITRIC ACID 500-334 MG/5ML PO SOLN: 30.0000 mL | ORAL | Status: DC | PRN

## 2018-05-04 MED ORDER — SIMETHICONE 80 MG PO CHEW: 80.0000 mg | CHEWABLE_TABLET | ORAL | Status: DC | PRN

## 2018-05-04 MED ORDER — BENZOCAINE-MENTHOL 20-0.5 % EX AERO: 1.0000 "application " | INHALATION_SPRAY | CUTANEOUS | Status: DC | PRN

## 2018-05-04 MED ORDER — DIPHENHYDRAMINE HCL 50 MG/ML IJ SOLN: 12.5000 mg | INTRAMUSCULAR | Status: DC | PRN

## 2018-05-04 MED ORDER — LACTATED RINGERS IV SOLN: 500.0000 mL | INTRAVENOUS | Status: DC | PRN

## 2018-05-04 MED ORDER — TETANUS-DIPHTH-ACELL PERTUSSIS 5-2.5-18.5 LF-MCG/0.5 IM SUSP: 0.5000 mL | Freq: Once | INTRAMUSCULAR | Status: DC

## 2018-05-04 MED ORDER — BENZOCAINE-MENTHOL 20-0.5 % EX AERO
1.0000 "application " | INHALATION_SPRAY | CUTANEOUS | Status: DC | PRN
  Administered 2018-05-05 – 2018-05-06 (×2): 1 via TOPICAL
  Filled 2018-05-04 (×2): qty 56

## 2018-05-04 MED ORDER — PHENYLEPHRINE 40 MCG/ML (10ML) SYRINGE FOR IV PUSH (FOR BLOOD PRESSURE SUPPORT)
80.0000 ug | PREFILLED_SYRINGE | INTRAVENOUS | Status: DC | PRN
  Filled 2018-05-04: qty 10

## 2018-05-04 MED ORDER — ONDANSETRON HCL 4 MG PO TABS
4.0000 mg | ORAL_TABLET | ORAL | Status: DC | PRN
Start: 2018-05-04 — End: 2018-05-04

## 2018-05-04 MED ORDER — LACTATED RINGERS IV SOLN: 500.0000 mL | Freq: Once | INTRAVENOUS | Status: DC

## 2018-05-04 MED ORDER — WITCH HAZEL-GLYCERIN EX PADS
1.0000 "application " | MEDICATED_PAD | CUTANEOUS | Status: DC | PRN
  Administered 2018-05-06: 1 via TOPICAL

## 2018-05-04 MED ORDER — PRENATAL MULTIVITAMIN CH
1.0000 | ORAL_TABLET | Freq: Every day | ORAL | Status: DC
  Administered 2018-05-06: 1 via ORAL
  Filled 2018-05-04 (×2): qty 1

## 2018-05-04 MED ORDER — ONDANSETRON HCL 4 MG/2ML IJ SOLN: 4.0000 mg | Freq: Four times a day (QID) | INTRAMUSCULAR | Status: DC | PRN

## 2018-05-04 MED ORDER — OXYTOCIN BOLUS FROM INFUSION
500.0000 mL | Freq: Once | INTRAVENOUS | Status: AC
  Administered 2018-05-04: 500 mL via INTRAVENOUS

## 2018-05-04 MED ORDER — HYDROXYZINE HCL 50 MG PO TABS
50.0000 mg | ORAL_TABLET | Freq: Four times a day (QID) | ORAL | Status: DC | PRN
  Filled 2018-05-04: qty 1

## 2018-05-04 MED ORDER — FENTANYL CITRATE (PF) 100 MCG/2ML IJ SOLN
50.0000 ug | INTRAMUSCULAR | Status: DC | PRN
  Administered 2018-05-04: 100 ug via INTRAVENOUS
  Filled 2018-05-04: qty 2

## 2018-05-04 MED ORDER — ZOLPIDEM TARTRATE 5 MG PO TABS: 5.0000 mg | ORAL_TABLET | Freq: Every evening | ORAL | Status: DC | PRN

## 2018-05-04 MED ORDER — FENTANYL 2.5 MCG/ML BUPIVACAINE 1/10 % EPIDURAL INFUSION (WH - ANES)
14.0000 mL/h | INTRAMUSCULAR | Status: DC | PRN
  Administered 2018-05-04 (×2): 14 mL/h via EPIDURAL
  Filled 2018-05-04: qty 100

## 2018-05-04 MED ORDER — IBUPROFEN 600 MG PO TABS: 600.0000 mg | ORAL_TABLET | Freq: Four times a day (QID) | ORAL | Status: DC

## 2018-05-04 MED ORDER — PHENYLEPHRINE 40 MCG/ML (10ML) SYRINGE FOR IV PUSH (FOR BLOOD PRESSURE SUPPORT): 80.0000 ug | PREFILLED_SYRINGE | INTRAVENOUS | Status: DC | PRN

## 2018-05-04 MED ORDER — MISOPROSTOL 200 MCG PO TABS: 800.0000 ug | ORAL_TABLET | Freq: Once | ORAL | Status: DC

## 2018-05-04 MED ORDER — MISOPROSTOL 200 MCG PO TABS
800.0000 ug | ORAL_TABLET | Freq: Once | ORAL | Status: AC
  Administered 2018-05-04: 800 ug via BUCCAL

## 2018-05-04 MED ORDER — OXYCODONE-ACETAMINOPHEN 5-325 MG PO TABS: 1.0000 | ORAL_TABLET | ORAL | Status: DC | PRN

## 2018-05-04 MED ORDER — SENNOSIDES-DOCUSATE SODIUM 8.6-50 MG PO TABS: 2.0000 | ORAL_TABLET | ORAL | Status: DC

## 2018-05-04 MED ORDER — MISOPROSTOL 200 MCG PO TABS
ORAL_TABLET | ORAL | Status: AC
  Filled 2018-05-04: qty 4

## 2018-05-04 NOTE — Anesthesia Pain Management Evaluation Note (Signed)
  CRNA Pain Management Visit Note  Patient: Eileen Morris, 22 y.o., female  "Hello I am a member of the anesthesia team at [facility]. We have an anesthesia team available at all times to provide care throughout the hospital, including epidural management and anesthesia for C-section. I don't know your plan for the delivery whether it a natural birth, water birth, IV sedation, nitrous supplementation, doula or epidural, but we want to meet your pain goals."   1.Was your pain managed to your expectations on prior hospitalizations?   No prior hospitalizations  2.What is your expectation for pain management during this hospitalization?     Epidural and IV pain meds  3.How can we help you reach that goal? Patient requesting epidural now and RN preparing for epidural  Record the patient's initial score and the patient's pain goal.   Pain: 10  Pain Goal: 5 [facility] wants you to be able to say your pain was always managed very well.  [name] 05/04/2018

## 2018-05-04 NOTE — H&P (Signed)
Eileen Morris is a 22 y.o. female G3P0020 @ 40.2 wks presenting for contractions since 0500 this morning. GBS neg. Denies ROM or vag bleeding. OB History    Gravida  3   Para      Term      Preterm      AB  2   Living        SAB  2   TAB      Ectopic      Multiple      Live Births             Past Medical History:  Diagnosis Date  . Medical history non-contributory    Past Surgical History:  Procedure Laterality Date  . HAND SURGERY     Family History: family history includes Cancer in her paternal grandmother; Hypertension in her maternal aunt and mother. Social History:  reports that she has never smoked. She has never used smokeless tobacco. She reports that she does not drink alcohol or use drugs.     Maternal Diabetes: No Genetic Screening: Normal Maternal Ultrasounds/Referrals: Normal Fetal Ultrasounds or other Referrals:  None Maternal Substance Abuse:  No Significant Maternal Medications:  None Significant Maternal Lab Results:  None Other Comments:  None  ROS Maternal Medical History:  Reason for admission: Contractions.   Contractions: Onset was 6-12 hours ago.   Frequency: regular.    Fetal activity: Perceived fetal activity is normal.   Last perceived fetal movement was within the past hour.    Prenatal complications: no prenatal complications Prenatal Complications - Diabetes: none.    Dilation: 3.5 Effacement (%): 80 Station: -2 Exam by:: Marvel PlanJessica Hashem RN, Janeth Rasehristina Robinson RN Blood pressure 133/72, pulse 91, temperature (!) 97.5 F (36.4 C), resp. rate 18, height 5\' 4"  (1.626 m), weight 222 lb (100.7 kg), last menstrual period 07/26/2017, SpO2 100 %, unknown if currently breastfeeding. Maternal Exam:  Uterine Assessment: Contraction strength is moderate.  Contraction frequency is regular.   Abdomen: Patient reports no abdominal tenderness. Fetal presentation: vertex  Introitus: Normal vulva. Normal vagina.  Ferning  test: not done.  Nitrazine test: not done. Amniotic fluid character: not assessed.  Pelvis: adequate for delivery.   Cervix: Cervix evaluated by digital exam.     Fetal Exam Fetal Monitor Review: Mode: ultrasound.   Baseline rate: 130's.  Variability: moderate (6-25 bpm).   Pattern: accelerations present and no decelerations.    Fetal State Assessment: Category I - tracings are normal.     Physical Exam  Constitutional: She is oriented to person, place, and time. She appears well-developed and well-nourished.  HENT:  Head: Normocephalic.  Neck: Normal range of motion.  Cardiovascular: Normal rate, regular rhythm, normal heart sounds and intact distal pulses.  Respiratory: Effort normal and breath sounds normal.  GI: Soft.  Genitourinary: Vagina normal and uterus normal.  Musculoskeletal: Normal range of motion.  Neurological: She is alert and oriented to person, place, and time. She has normal reflexes.  Skin: Skin is warm and dry.  Psychiatric: She has a normal mood and affect. Her behavior is normal. Judgment and thought content normal.    Prenatal labs: ABO, Rh: A/Positive/-- (12/12 1640) Antibody: Negative (12/12 1640) Rubella: 2.43 (12/12 1640) RPR: Non Reactive (03/06 1045)  HBsAg: Negative (12/12 1640)  HIV: Non Reactive (03/06 1045)  GBS: Negative (05/08 1419)   Assessment/Plan: GBS neg preg at 40.2 wks early labor SVE 4/100/-1 vertex admit   Wyvonnia DuskyMarie Gaelle Adriance 05/04/2018, 10:47 AM

## 2018-05-04 NOTE — MAU Note (Signed)
Pt. States ctx. Began 0500 today and were about 4 minutes apart.  Pt. Reports good FM. Pt. Denies bleeding or leaking fluid.

## 2018-05-04 NOTE — Anesthesia Preprocedure Evaluation (Signed)
Anesthesia Evaluation  Patient identified by MRN, date of birth, ID band Patient awake    Reviewed: Allergy & Precautions, NPO status , Patient's Chart, lab work & pertinent test results  Airway Mallampati: II  TM Distance: >3 FB Neck ROM: Full    Dental no notable dental hx.    Pulmonary neg pulmonary ROS,    Pulmonary exam normal breath sounds clear to auscultation       Cardiovascular negative cardio ROS Normal cardiovascular exam Rhythm:Regular Rate:Normal     Neuro/Psych negative neurological ROS  negative psych ROS   GI/Hepatic negative GI ROS, Neg liver ROS,   Endo/Other  negative endocrine ROS  Renal/GU negative Renal ROS     Musculoskeletal negative musculoskeletal ROS (+)   Abdominal   Peds  Hematology negative hematology ROS (+)   Anesthesia Other Findings   Reproductive/Obstetrics (+) Pregnancy                             Anesthesia Physical Anesthesia Plan  ASA: II  Anesthesia Plan: Epidural   Post-op Pain Management:    Induction:   PONV Risk Score and Plan:   Airway Management Planned:   Additional Equipment:   Intra-op Plan:   Post-operative Plan:   Informed Consent: I have reviewed the patients History and Physical, chart, labs and discussed the procedure including the risks, benefits and alternatives for the proposed anesthesia with the patient or authorized representative who has indicated his/her understanding and acceptance.       Plan Discussed with:   Anesthesia Plan Comments:         Anesthesia Quick Evaluation  

## 2018-05-04 NOTE — Progress Notes (Signed)
Labor Progress Note  Eileen EpleyFlor De Sol Felipa Morris is a 22 y.o. G3P0020 at 1328w2d admitted for latent labor  S: Doing well. Just got epidural and comfortable.   O:  BP 122/75   Pulse 86   Temp 98.1 F (36.7 C)   Resp 16   Ht 5\' 4"  (1.626 m)   Wt 100.7 kg (222 lb)   LMP 07/26/2017   SpO2 100%   BMI 38.11 kg/m   No intake/output data recorded.  FHT:  FHR: 135 bpm, variability: moderate,  accelerations:  Present,  decelerations:  Absent UC:   regular, every 2-3 minutes SVE:   Dilation: 5.5 Effacement (%): 100 Station: -1 Exam by:: phelps AROM: 1455 clear  Labs: Lab Results  Component Value Date   WBC 11.6 (H) 05/04/2018   HGB 13.3 05/04/2018   HCT 38.9 05/04/2018   MCV 90.7 05/04/2018   PLT 188 05/04/2018    Assessment / Plan: 22 y.o. G3P0020 428w2d in early labor Spontaneous labor, progressing normally  Labor: Progressing normally, AROM'd Fetal Wellbeing:  Category I Pain Control:  Epidural Anticipated MOD:  NSVD  Expectant management   Caryl AdaJazma Phelps, DO OB Fellow Center for [facility], [facility]

## 2018-05-04 NOTE — Anesthesia Procedure Notes (Signed)
Epidural Patient location during procedure: OB Start time: 05/04/2018 2:10 PM End time: 05/04/2018 2:20 PM  Staffing Anesthesiologist: Lewie LoronGermeroth, Adelaine Roppolo, MD Performed: anesthesiologist   Preanesthetic Checklist Completed: patient identified, pre-op evaluation, timeout performed, IV checked, risks and benefits discussed and monitors and equipment checked  Epidural Patient position: sitting Prep: site prepped and draped and DuraPrep Patient monitoring: heart rate, continuous pulse ox and blood pressure Approach: midline Location: L3-L4 Injection technique: LOR air and LOR saline  Needle:  Needle type: Tuohy  Needle gauge: 17 G Needle length: 9 cm Needle insertion depth: 7 cm Catheter type: closed end flexible Catheter size: 19 Gauge Catheter at skin depth: 12 cm Test dose: negative  Assessment Sensory level: T8 Events: blood not aspirated, injection not painful, no injection resistance, negative IV test and no paresthesia  Additional Notes Reason for block:procedure for pain

## 2018-05-04 NOTE — MAU Note (Signed)
Urine in lab 

## 2018-05-05 NOTE — Lactation Note (Signed)
This note was copied from a baby's chart. Lactation Consultation Note  Patient Name: Eileen Morris ZOXWR'UToday's Date: 05/05/2018 Reason for consult: Follow-up assessment;Difficult latch  Assisted mother to latch in the football hold on the right breast.  Repeated attempts needed to obtain a latch and then infant would only suck a few times before becoming sleepy.  Gentle stimulation and rubbing caused baby to awaken but only for a  few more sucks.  Explained to mother that he will awaken easier as the day progresses.  Continue to do STS and watch for feeding cues.  Call for latch assistance as needed.  FOB and grandmother present and supportive.   Maternal Data Formula Feeding for Exclusion: No Does the patient have breastfeeding experience prior to this delivery?: No  Feeding Feeding Type: Breast Fed Length of feed: 10 min  LATCH Score Latch: Repeated attempts needed to sustain latch, nipple held in mouth throughout feeding, stimulation needed to elicit sucking reflex.  Audible Swallowing: None  Type of Nipple: Everted at rest and after stimulation(short shafted bilaterally)  Comfort (Breast/Nipple): Soft / non-tender  Hold (Positioning): Assistance needed to correctly position infant at breast and maintain latch.  LATCH Score: 6  Interventions Interventions: Breast feeding basics reviewed;Assisted with latch;Skin to skin;Breast massage;Hand express;Position options;Support pillows;Adjust position;Breast compression  Lactation Tools Discussed/Used     Consult Status Consult Status: Follow-up Date: 05/06/18 Follow-up type: In-patient    Aiyana Stegmann R Willard Farquharson 05/05/2018, 1:33 AM

## 2018-05-05 NOTE — Lactation Note (Signed)
This note was copied from a baby's chart. Lactation Consultation Note  Patient Name: Eileen Morris Reason for consult: Initial assessment;Primapara;1st time breastfeeding;Term  P1 mother whose infant is now 704 hours old.  Infant is swaddled and being held by grandmother.  Mother would like latch assistance when infant is ready to feed.  I reviewed feeding cues with mother, STS and how to awaken a sleepy baby.  Encouraged her to feed 8-12 times/24 hours or earlier if he shows cues.  Reminded her that this is normal behavior at 4 hours of age.  Mother did a little hand expression but no drops of colostrum were obtained.  Mom made aware of O/P services, breastfeeding support groups, community resources, and our phone # for post-discharge questions. Mother will call for assistance as needed.  FOB and grandmother present.   Maternal Data Formula Feeding for Exclusion: No Does the patient have breastfeeding experience prior to this delivery?: No  Feeding    LATCH Score                   Interventions    Lactation Tools Discussed/Used     Consult Status Consult Status: Follow-up Date: 05/06/18 Follow-up type: In-patient    [name], 12:25 AM

## 2018-05-05 NOTE — Progress Notes (Signed)
Post Partum Day 1 Subjective: no complaints, up ad lib, tolerating PO and + flatus  Objective: Blood pressure 119/65, pulse 97, temperature 97.7 F (36.5 C), resp. rate 18, height 5\' 4"  (1.626 m), weight 222 lb (100.7 kg), last menstrual period 07/26/2017, SpO2 100 %, unknown if currently breastfeeding.  Physical Exam:  General: alert, cooperative and no distress Lochia: appropriate Uterine Fundus: firm Incision: n/a DVT Evaluation: No evidence of DVT seen on physical exam.  Recent Labs    05/04/18 1140  HGB 13.3  HCT 38.9    Assessment/Plan: Plan for discharge tomorrow   LOS: 1 day   Garnette Gunneraron B Thompson 05/05/2018, 7:05 AM

## 2018-05-05 NOTE — Anesthesia Postprocedure Evaluation (Signed)
Anesthesia Post Note  Patient: Eileen EpleyFlor De Andree CossSol Morris  Procedure(s) Performed: AN AD HOC LABOR EPIDURAL     Patient location during evaluation: Mother Baby Anesthesia Type: Epidural Level of consciousness: awake and alert Pain management: pain level controlled Vital Signs Assessment: post-procedure vital signs reviewed and stable Respiratory status: spontaneous breathing, nonlabored ventilation and respiratory function stable Cardiovascular status: stable Postop Assessment: no headache, no backache and epidural receding Anesthetic complications: no    Last Vitals:  Vitals:   05/05/18 0213 05/05/18 0500  BP: 125/66 119/65  Pulse: (!) 101 97  Resp: 18 18  Temp: 36.8 C 36.5 C  SpO2: 96% 100%    Last Pain:  Vitals:   05/05/18 0645  TempSrc:   PainSc: 3    Pain Goal: Patients Stated Pain Goal: 5 (05/04/18 0951)               Marrion CoyMERRITT,Dominique Calvey

## 2018-05-06 LAB — RPR: RPR: NONREACTIVE

## 2018-05-06 MED ORDER — CYCLOBENZAPRINE HCL 10 MG PO TABS: 10.0000 mg | ORAL_TABLET | Freq: Every evening | ORAL | 0 refills | Status: DC | PRN

## 2018-05-06 MED ORDER — IBUPROFEN 600 MG PO TABS: 600.0000 mg | ORAL_TABLET | Freq: Four times a day (QID) | ORAL | 0 refills | Status: DC

## 2018-05-06 NOTE — Discharge Instructions (Signed)
Vaginal Delivery  Vaginal delivery means that you will give birth by pushing your baby out of your birth canal (vagina). A team of health care providers will help you before, during, and after vaginal delivery. Birth experiences are unique for every woman and every pregnancy, and birth experiences vary depending on where you choose to give birth.  What should I do to prepare for my baby's birth?  Before your baby is born, it is important to talk with your health care provider about:   Your labor and delivery preferences. These may include:  ? Medicines that you may be given.  ? How you will manage your pain. This might include non-medical pain relief techniques or injectable pain relief such as epidural analgesia.  ? How you and your baby will be monitored during labor and delivery.  ? Who may be in the labor and delivery room with you.  ? Your feelings about surgical delivery of your baby (cesarean delivery, or C-section) if this becomes necessary.  ? Your feelings about receiving donated blood through an IV tube (blood transfusion) if this becomes necessary.   Whether you are able:  ? To take pictures or videos of the birth.  ? To eat during labor and delivery.  ? To move around, walk, or change positions during labor and delivery.   What to expect after your baby is born, such as:  ? Whether delayed umbilical cord clamping and cutting is offered.  ? Who will care for your baby right after birth.  ? Medicines or tests that may be recommended for your baby.  ? Whether breastfeeding is supported in your hospital or birth center.  ? How long you will be in the hospital or birth center.   How any medical conditions you have may affect your baby or your labor and delivery experience.    To prepare for your baby's birth, you should also:   Attend all of your health care visits before delivery (prenatal visits) as recommended by your health care provider. This is important.   Prepare your home for your baby's  arrival. Make sure that you have:  ? Diapers.  ? Baby clothing.  ? Feeding equipment.  ? Safe sleeping arrangements for you and your baby.   Install a car seat in your vehicle. Have your car seat checked by a certified car seat installer to make sure that it is installed safely.   Think about who will help you with your new baby at home for at least the first several weeks after delivery.    What can I expect when I arrive at the birth center or hospital?  Once you are in labor and have been admitted into the hospital or birth center, your health care provider may:   Review your pregnancy history and any concerns you have.   Insert an IV tube into one of your veins. This is used to give you fluids and medicines.   Check your blood pressure, pulse, temperature, and heart rate (vital signs).   Check whether your bag of water (amniotic sac) has broken (ruptured).   Talk with you about your birth plan and discuss pain control options.    Monitoring  Your health care provider may monitor your contractions (uterine monitoring) and your baby's heart rate (fetal monitoring). You may need to be monitored:   Often, but not continuously (intermittently).   All the time or for long periods at a time (continuously). Continuous monitoring may be needed if:  ?   You are taking certain medicines, such as medicine to relieve pain or make your contractions stronger.  ? You have pregnancy or labor complications.    Monitoring may be done by:   Placing a special stethoscope or a handheld monitoring device on your abdomen to check your baby's heartbeat, and feeling your abdomen for contractions. This method of monitoring does not continuously record your baby's heartbeat or your contractions.   Placing monitors on your abdomen (external monitors) to record your baby's heartbeat and the frequency and length of contractions. You may not have to wear external monitors all the time.   Placing monitors inside of your uterus  (internal monitors) to record your baby's heartbeat and the frequency, length, and strength of your contractions.  ? Your health care provider may use internal monitors if he or she needs more information about the strength of your contractions or your baby's heart rate.  ? Internal monitors are put in place by passing a thin, flexible wire through your vagina and into your uterus. Depending on the type of monitor, it may remain in your uterus or on your baby's head until birth.  ? Your health care provider will discuss the benefits and risks of internal monitoring with you and will ask for your permission before inserting the monitors.   Telemetry. This is a type of continuous monitoring that can be done with external or internal monitors. Instead of having to stay in bed, you are able to move around during telemetry. Ask your health care provider if telemetry is an option for you.    Physical exam  Your health care provider may perform a physical exam. This may include:   Checking whether your baby is positioned:  ? With the head toward your vagina (head-down). This is most common.  ? With the head toward the top of your uterus (head-up or breech). If your baby is in a breech position, your health care provider may try to turn your baby to a head-down position so you can deliver vaginally. If it does not seem that your baby can be born vaginally, your provider may recommend surgery to deliver your baby. In rare cases, you may be able to deliver vaginally if your baby is head-up (breech delivery).  ? Lying sideways (transverse). Babies that are lying sideways cannot be delivered vaginally.   Checking your cervix to determine:  ? Whether it is thinning out (effacing).  ? Whether it is opening up (dilating).  ? How low your baby has moved into your birth canal.    What are the three stages of labor and delivery?    Normal labor and delivery is divided into the following three stages:  Stage 1   Stage 1 is the  longest stage of labor, and it can last for hours or days. Stage 1 includes:  ? Early labor. This is when contractions may be irregular, or regular and mild. Generally, early labor contractions are more than 10 minutes apart.  ? Active labor. This is when contractions get longer, more regular, more frequent, and more intense.  ? The transition phase. This is when contractions happen very close together, are very intense, and may last longer than during any other part of labor.   Contractions generally feel mild, infrequent, and irregular at first. They get stronger, more frequent (about every 2-3 minutes), and more regular as you progress from early labor through active labor and transition.   Many women progress through stage 1 naturally, but   you may need help to continue making progress. If this happens, your health care provider may talk with you about:  ? Rupturing your amniotic sac if it has not ruptured yet.  ? Giving you medicine to help make your contractions stronger and more frequent.   Stage 1 ends when your cervix is completely dilated to 4 inches (10 cm) and completely effaced. This happens at the end of the transition phase.  Stage 2   Once your cervix is completely effaced and dilated to 4 inches (10 cm), you may start to feel an urge to push. It is common for the body to naturally take a rest before feeling the urge to push, especially if you received an epidural or certain other pain medicines. This rest period may last for up to 1-2 hours, depending on your unique labor experience.   During stage 2, contractions are generally less painful, because pushing helps relieve contraction pain. Instead of contraction pain, you may feel stretching and burning pain, especially when the widest part of your baby's head passes through the vaginal opening (crowning).   Your health care provider will closely monitor your pushing progress and your baby's progress through the vagina during stage 2.   Your  health care provider may massage the area of skin between your vaginal opening and anus (perineum) or apply warm compresses to your perineum. This helps it stretch as the baby's head starts to crown, which can help prevent perineal tearing.  ? In some cases, an incision may be made in your perineum (episiotomy) to allow the baby to pass through the vaginal opening. An episiotomy helps to make the opening of the vagina larger to allow more room for the baby to fit through.   It is very important to breathe and focus so your health care provider can control the delivery of your baby's head. Your health care provider may have you decrease the intensity of your pushing, to help prevent perineal tearing.   After delivery of your baby's head, the shoulders and the rest of the body generally deliver very quickly and without difficulty.   Once your baby is delivered, the umbilical cord may be cut right away, or this may be delayed for 1-2 minutes, depending on your baby's health. This may vary among health care providers, hospitals, and birth centers.   If you and your baby are healthy enough, your baby may be placed on your chest or abdomen to help maintain the baby's temperature and to help you bond with each other. Some mothers and babies start breastfeeding at this time. Your health care team will dry your baby and help keep your baby warm during this time.   Your baby may need immediate care if he or she:  ? Showed signs of distress during labor.  ? Has a medical condition.  ? Was born too early (prematurely).  ? Had a bowel movement before birth (meconium).  ? Shows signs of difficulty transitioning from being inside the uterus to being outside of the uterus.  If you are planning to breastfeed, your health care team will help you begin a feeding.  Stage 3   The third stage of labor starts immediately after the birth of your baby and ends after you deliver the placenta. The placenta is an organ that develops  during pregnancy to provide oxygen and nutrients to your baby in the womb.   Delivering the placenta may require some pushing, and you may have mild contractions. Breastfeeding   you to breastfeed your baby. After labor is over, you and your baby will be monitored closely to ensure that you are both healthy until you are ready to go home. Your health care team will teach you how to care for yourself and your baby. This information is not intended to replace advice given to you by your health care provider. Make sure you discuss any questions you have with your health care provider. Document Released: 08/28/2008 Document Revised: 06/08/2016 Document Reviewed: 12/04/2015 Elsevier Interactive Patient Education  2018 [employer].   Contraception Choices Contraception, also called birth control, means things to use or ways to try not to get pregnant. Hormonal birth control This kind of birth control uses hormones. Here are some types of hormonal birth control:  A tube that is put under skin of the arm (implant). The tube can stay in for as long as 3 years.  Shots to get every 3 months (injections).  Pills to take every day (birth control pills).  A patch to change 1 time each week for 3 weeks (birth control patch). After that, the patch is taken off for 1 week.  A ring to put in the vagina. The ring is left in for 3 weeks.  Then it is taken out of the vagina for 1 week. Then a new ring is put in.  Pills to take after unprotected sex (emergency birth control pills).  Barrier birth control Here are some types of barrier birth control:  A thin covering that is put on the penis before sex (female condom). The covering is thrown away after sex.  A soft, loose covering that is put in the vagina before sex (female condom). The covering is thrown away after sex.  A rubber bowl that sits over the cervix (diaphragm). The bowl must be made for you. The bowl is put into the vagina before sex. The bowl is left in for 6-8 hours after sex. It is taken out within 24 hours.  A small, soft cup that fits over the cervix (cervical cap). The cup must be made for you. The cup can be left in for 6-8 hours after sex. It is taken out within 48 hours.  A sponge that is put into the vagina before sex. It must be left in for at least 6 hours after sex. It must be taken out within 30 hours. Then it is thrown away.  A chemical that kills or stops sperm from getting into the uterus (spermicide). It may be a pill, cream, jelly, or foam to put in the vagina. The chemical should be used at least 10-15 minutes before sex.  IUD (intrauterine) birth control An IUD is a small, T-shaped piece of plastic. It is put inside the uterus. There are two kinds:  Hormone IUD. This kind can stay in for 3-5 years.  Copper IUD. This kind can stay in for 10 years.  Permanent birth control Here are some types of permanent birth control:  Surgery to block the fallopian tubes.  Having an insert put into each fallopian tube.  Surgery to tie off the tubes that carry sperm (vasectomy).  Natural planning birth control Here are some types of natural planning birth control:  Not having sex on the days the woman could get pregnant.  Using a calendar: ? To keep track of the length of each period. ? To find out what days pregnancy can happen. ? To plan to  not have sex on days when pregnancy can happen.  Watching for symptoms of ovulation and not having sex during ovulation. One way the woman can check for ovulation is to check her temperature.  Waiting to have sex until after ovulation.  Summary  Contraception, also called birth control, means things to use or ways to try not to get pregnant.  Hormonal methods of birth control include implants, injections, pills, patches, vaginal rings, and emergency birth control pills.  Barrier methods of birth control can include female condoms, female condoms, diaphragms, cervical caps, sponges, and spermicides.  There are two types of IUD (intrauterine device) birth control. An IUD can be put in a woman's uterus to prevent pregnancy for 3-5 years.  Permanent sterilization can be done through a procedure for males, females, or both.  Natural planning methods involve not having sex on the days when the woman could get pregnant. This information is not intended to replace advice given to you by your health care provider. Make sure you discuss any questions you have with your health care provider. Document Released: 09/16/2009 Document Revised: 11/29/2016 Document Reviewed: 11/29/2016 Elsevier Interactive Patient Education  2017 [employer]   For more information on contraception methods, visit this website: www.bedsider.org

## 2018-05-06 NOTE — Discharge Summary (Signed)
OB Discharge Summary     Patient Name: Eileen Morris DOB: [birth year] MRN: [id number]  Date of admission: 05/04/2018 Delivering MD: Eileen Morris   Date of discharge: 05/06/2018  Admitting diagnosis: 40WKS, CTX Intrauterine pregnancy: [redacted]w[redacted]d     Secondary diagnosis:  Active Problems:   Abdominal pain in pregnancy, third trimester   SVD (spontaneous vaginal delivery)  Additional problems: none     Discharge diagnosis: Term Pregnancy Delivered                                                                                                Post partum procedures:none  Augmentation: AROM  Complications: None  Hospital course:  Onset of Labor With Vaginal Delivery     22 y.o. yo [id number] at [redacted]w[redacted]d was admitted in Active Labor on 05/04/2018. Patient had an uncomplicated labor course as follows:  Membrane Rupture Time/Date: 2:54 PM ,05/04/2018   Intrapartum Procedures: Episiotomy: None [1]                                         Lacerations:  2nd degree [3];Perineal [11]  Patient had Morris delivery of Morris Viable infant. 05/04/2018  Information for the patient's newborn:  Eileen Morris [[id number]]  Delivery Method: Vaginal, Spontaneous(Filed from Delivery Summary)    [name] had an uncomplicated postpartum course.  She is ambulating, tolerating Morris regular diet, passing flatus, and urinating well. Patient is discharged home in stable condition on 05/06/18.   Physical exam  Vitals:   05/05/18 0500 05/05/18 1100 05/05/18 1854 05/06/18 0608  BP: 119/65 111/66 109/80 126/61  Pulse: 97 96 95 97  Resp: 18 18 18 18   Temp: 97.7 F (36.5 C) 98.6 F (37 C) 98.4 F (36.9 C) 98.7 F (37.1 C)  TempSrc:  Axillary Oral Oral  SpO2: 100% 99% 100%   Weight:      Height:       General: alert, cooperative and no distress Lochia: appropriate Uterine Fundus: firm Incision: N/Morris DVT Evaluation: trace LE edema Labs: Lab Results  Component Value Date   WBC 11.6 (H) 05/04/2018   HGB 13.3  05/04/2018   HCT 38.9 05/04/2018   MCV 90.7 05/04/2018   PLT 188 05/04/2018   No flowsheet data found.  Discharge instruction: per After Visit Summary and "Baby and Me Booklet".  After visit meds:  Allergies as of 05/06/2018   No Known Allergies     Medication List    TAKE these medications   acetaminophen 500 MG tablet Commonly known as:  TYLENOL Take 500 mg by mouth every 6 (six) hours as needed for headache.   COMFORT FIT MATERNITY SUPP SM Misc Wear as directed.   cyclobenzaprine 10 MG tablet Commonly known as:  FLEXERIL Take 1 tablet (10 mg total) by mouth at bedtime as needed for muscle spasms.   ibuprofen 600 MG tablet Commonly known as:  ADVIL,MOTRIN Take 1 tablet (600 mg total) by mouth every 6 (six) hours.   loratadine 10  MG tablet Commonly known as:  CLARITIN Take 1 tablet (10 mg total) by mouth daily.   ondansetron 8 MG disintegrating tablet Commonly known as:  ZOFRAN ODT Take 1 tablet (8 mg total) by mouth every 8 (eight) hours as needed for nausea or vomiting.   prenatal multivitamin Tabs tablet Take 1 tablet by mouth daily at 12 noon.   ranitidine 150 MG tablet Commonly known as:  ZANTAC Take 1 tablet (150 mg total) by mouth 2 (two) times daily.       Diet: routine diet  Activity: Advance as tolerated. Pelvic rest for 6 weeks.   Outpatient follow up:4 weeks Follow up Appt: Future Appointments  Date Time Provider Department Center  06/02/2018 10:00 AM Eileen BadHarper, Charles A, MD [facility] None   Follow up Visit:No follow-ups on file.  Postpartum contraception: Undecided  Newborn Data: Live born female  Birth Weight: 9 lb 2.9 oz (4165 g) APGAR: 9, 9  Newborn Delivery   Birth date/time:  05/04/2018 19:33:00 Delivery type:  Vaginal, Spontaneous     Baby Feeding: Breast Disposition:home with mother   05/06/2018 Eileen PearJulie P Jovanni Eckhart, MD

## 2018-05-06 NOTE — Lactation Note (Signed)
This note was copied from a baby's chart. Lactation Consultation Note  Patient Name: Eileen Morris Reason for consult: Follow-up assessment  Infant is nursing very well. Swallows are clearly evident. Mom is comfortable with latch. Her breasts are filling.   Mom does not have a pump at home, but she was provided a hand pump. Her nipple diameter suggests that she needs size 21 flanges, which were provided.   EBM storage guidelines reviewed. Mom's questions answered.    Eileen Morris, Eileen Morris Morris, 1:09 PM

## 2018-05-07 ENCOUNTER — Encounter: Payer: Medicaid Other | Admitting: Obstetrics

## 2018-05-09 ENCOUNTER — Inpatient Hospital Stay (HOSPITAL_COMMUNITY): Admission: RE | Admit: 2018-05-09 | Payer: Medicaid Other | Source: Ambulatory Visit

## 2018-06-02 ENCOUNTER — Ambulatory Visit: Payer: Medicaid Other | Admitting: Obstetrics

## 2018-06-17 ENCOUNTER — Encounter: Payer: Self-pay | Admitting: Obstetrics

## 2018-06-17 ENCOUNTER — Ambulatory Visit (INDEPENDENT_AMBULATORY_CARE_PROVIDER_SITE_OTHER): Payer: Medicaid Other | Admitting: Obstetrics

## 2018-06-17 DIAGNOSIS — Z1389 Encounter for screening for other disorder: Secondary | ICD-10-CM

## 2018-06-17 DIAGNOSIS — Z30011 Encounter for initial prescription of contraceptive pills: Secondary | ICD-10-CM

## 2018-06-17 DIAGNOSIS — Z3009 Encounter for other general counseling and advice on contraception: Secondary | ICD-10-CM

## 2018-06-17 DIAGNOSIS — K5901 Slow transit constipation: Secondary | ICD-10-CM

## 2018-06-17 MED ORDER — NORETHINDRONE 0.35 MG PO TABS
1.0000 | ORAL_TABLET | Freq: Every day | ORAL | 11 refills | Status: DC
Start: 2018-06-17 — End: 2019-11-25

## 2018-06-17 MED ORDER — DOCUSATE SODIUM 100 MG PO CAPS: 100.0000 mg | ORAL_CAPSULE | Freq: Two times a day (BID) | ORAL | 11 refills | Status: DC

## 2018-06-17 NOTE — Progress Notes (Signed)
Post Partum Exam  Eileen Morris is a 22 y.o. [phone] female who presents for a postpartum visit. She is 6 weeks postpartum following a spontaneous vaginal delivery. I have fully reviewed the prenatal and intrapartum course. The delivery was at 40 gestational weeks.  Anesthesia: epidural. Postpartum course has been well. Baby's course has been well. Baby is feeding by breast. Bleeding no bleeding. Bowel function is constipated. Bladder function is normal. Patient is sexually active. Contraception method is condoms.wants pills  Postpartum depression screening:neg  Pt notices some discomfort in vaginal "upper" area.   The following portions of the patient's history were reviewed and updated as appropriate: allergies, current medications, past family history, past medical history, past social history, past surgical history and problem list. Last pap smear done 11/13/2017 and was Normal  Review of Systems A comprehensive review of systems was negative.    Objective:  Last menstrual period 07/26/2017, unknown if currently breastfeeding.  PE:  Deferred  Assessment:   1. Postpartum care following vaginal delivery  2. Encounter for other general counseling and advice on contraception - wants OCP's  3. Encounter for initial prescription of contraceptive pills Rx: - norethindrone (MICRONOR,CAMILA,ERRIN) 0.35 MG tablet; Take 1 tablet (0.35 mg total) by mouth daily.  Dispense: 1 Package; Refill: 11  4. Constipation by delayed colonic transit Rx: - docusate sodium (COLACE) 100 MG capsule; Take 1 capsule (100 mg total) by mouth 2 (two) times daily.  Dispense: 60 capsule; Refill: 11   Plan:   1. Contraception: oral progesterone-only contraceptive 2. Micronor Rx 3. Follow up in: 6 weeks or as needed.    Brock BadHARLES A. HARPER MD 06-17-2018

## 2018-07-29 ENCOUNTER — Ambulatory Visit: Payer: Medicaid Other | Admitting: Obstetrics

## 2019-08-27 DIAGNOSIS — Z3490 Encounter for supervision of normal pregnancy, unspecified, unspecified trimester: Secondary | ICD-10-CM | POA: Insufficient documentation

## 2019-08-28 ENCOUNTER — Encounter: Payer: Self-pay | Admitting: Medical

## 2019-08-28 ENCOUNTER — Ambulatory Visit (INDEPENDENT_AMBULATORY_CARE_PROVIDER_SITE_OTHER): Payer: Medicaid Other | Admitting: Medical

## 2019-08-28 ENCOUNTER — Other Ambulatory Visit (HOSPITAL_COMMUNITY)
Admission: RE | Admit: 2019-08-28 | Discharge: 2019-08-28 | Disposition: A | Discharge status: Home / Self Care | Payer: Medicaid Other | Source: Ambulatory Visit | Attending: Medical | Admitting: Medical

## 2019-08-28 ENCOUNTER — Other Ambulatory Visit: Payer: Self-pay

## 2019-08-28 VITALS — BP 123/77 | HR 106 | Temp 97.9°F | Wt 202.4 lb

## 2019-08-28 DIAGNOSIS — Z3480 Encounter for supervision of other normal pregnancy, unspecified trimester: Secondary | ICD-10-CM | POA: Diagnosis present

## 2019-08-28 DIAGNOSIS — Z23 Encounter for immunization: Secondary | ICD-10-CM

## 2019-08-28 DIAGNOSIS — Z3A24 24 weeks gestation of pregnancy: Secondary | ICD-10-CM | POA: Diagnosis not present

## 2019-08-28 DIAGNOSIS — O0932 Supervision of pregnancy with insufficient antenatal care, second trimester: Secondary | ICD-10-CM

## 2019-08-28 MED ORDER — BLOOD PRESSURE KIT DEVI: 1.0000 | 0 refills | Status: DC | PRN

## 2019-08-28 NOTE — Patient Instructions (Addendum)
Second Trimester of Pregnancy  The second trimester is from week 14 through week 27 (month 4 through 6). This is often the time in pregnancy that you feel your best. Often times, morning sickness has lessened or quit. You may have more energy, and you may get hungry more often. Your unborn baby is growing rapidly. At the end of the sixth month, he or she is about 9 inches long and weighs about 1 pounds. You will likely feel the baby move between 18 and 20 weeks of pregnancy. Follow these instructions at home: Medicines  Take over-the-counter and prescription medicines only as told by your doctor. Some medicines are safe and some medicines are not safe during pregnancy.  Take a prenatal vitamin that contains at least 600 micrograms (mcg) of folic acid.  If you have trouble pooping (constipation), take medicine that will make your stool soft (stool softener) if your doctor approves. Eating and drinking   Eat regular, healthy meals.  Avoid raw meat and uncooked cheese.  If you get low calcium from the food you eat, talk to your doctor about taking a daily calcium supplement.  Avoid foods that are high in fat and sugars, such as fried and sweet foods.  If you feel sick to your stomach (nauseous) or throw up (vomit): ? Eat 4 or 5 small meals a day instead of 3 large meals. ? Try eating a few soda crackers. ? Drink liquids between meals instead of during meals.  To prevent constipation: ? Eat foods that are high in fiber, like fresh fruits and vegetables, whole grains, and beans. ? Drink enough fluids to keep your pee (urine) clear or pale yellow. Activity  Exercise only as told by your doctor. Stop exercising if you start to have cramps.  Do not exercise if it is too hot, too humid, or if you are in a place of great height (high altitude).  Avoid heavy lifting.  Wear low-heeled shoes. Sit and stand up straight.  You can continue to have sex unless your doctor tells you not to.  Relieving pain and discomfort  Wear a good support bra if your breasts are tender.  Take warm water baths (sitz baths) to soothe pain or discomfort caused by hemorrhoids. Use hemorrhoid cream if your doctor approves.  Rest with your legs raised if you have leg cramps or low back pain.  If you develop puffy, bulging veins (varicose veins) in your legs: ? Wear support hose or compression stockings as told by your doctor. ? Raise (elevate) your feet for 15 minutes, 3-4 times a day. ? Limit salt in your food. Prenatal care  Write down your questions. Take them to your prenatal visits.  Keep all your prenatal visits as told by your doctor. This is important. Safety  Wear your seat belt when driving.  Make a list of emergency phone numbers, including numbers for family, friends, the hospital, and police and fire departments. General instructions  Ask your doctor about the right foods to eat or for help finding a counselor, if you need these services.  Ask your doctor about local prenatal classes. Begin classes before month 6 of your pregnancy.  Do not use hot tubs, steam rooms, or saunas.  Do not douche or use tampons or scented sanitary pads.  Do not cross your legs for long periods of time.  Visit your dentist if you have not done so. Use a soft toothbrush to brush your teeth. Floss gently.  Avoid all smoking, herbs,   and alcohol. Avoid drugs that are not approved by your doctor.  Do not use any products that contain nicotine or tobacco, such as cigarettes and e-cigarettes. If you need help quitting, ask your doctor.  Avoid cat litter boxes and soil used by cats. These carry germs that can cause birth defects in the baby and can cause a loss of your baby (miscarriage) or stillbirth. Contact a doctor if:  You have mild cramps or pressure in your lower belly.  You have pain when you pee (urinate).  You have bad smelling fluid coming from your vagina.  You continue to feel  sick to your stomach (nauseous), throw up (vomit), or have watery poop (diarrhea).  You have a nagging pain in your belly area.  You feel dizzy. Get help right away if:  You have a fever.  You are leaking fluid from your vagina.  You have spotting or bleeding from your vagina.  You have severe belly cramping or pain.  You lose or gain weight rapidly.  You have trouble catching your breath and have chest pain.  You notice sudden or extreme puffiness (swelling) of your face, hands, ankles, feet, or legs.  You have not felt the baby move in over an hour.  You have severe headaches that do not go away when you take medicine.  You have trouble seeing. Summary  The second trimester is from week 14 through week 27 (months 4 through 6). This is often the time in pregnancy that you feel your best.  To take care of yourself and your unborn baby, you will need to eat healthy meals, take medicines only if your doctor tells you to do so, and do activities that are safe for you and your baby.  Call your doctor if you get sick or if you notice anything unusual about your pregnancy. Also, call your doctor if you need help with the right food to eat, or if you want to know what activities are safe for you. This information is not intended to replace advice given to you by your health care provider. Make sure you discuss any questions you have with your health care provider. Document Released: 02/13/2010 Document Revised: 03/13/2019 Document Reviewed: 12/25/2016 Elsevier Patient Education  2020 [employer].  Safe Medications in Pregnancy   Acne:  Benzoyl Peroxide  Salicylic Acid   Backache/Headache:  Tylenol: 2 regular strength every 4 hours OR        2 Extra strength every 6 hours   Colds/Coughs/Allergies:  Benadryl (alcohol free) 25 mg every 6 hours as needed  Breath right strips  Claritin  Cepacol throat lozenges  Chloraseptic throat spray  Cold-Eeze- up to three times  per day  Cough drops, alcohol free  Flonase (by prescription only)  Guaifenesin  Mucinex  Robitussin DM (plain only, alcohol free)  Saline nasal spray/drops  Sudafed (pseudoephedrine) & Actifed * use only after [redacted] weeks gestation and if you do not have high blood pressure  Tylenol  Vicks Vaporub  Zinc lozenges  Zyrtec   Constipation:  Colace  Ducolax suppositories  Fleet enema  Glycerin suppositories  Metamucil  Milk of magnesia  Miralax  Senokot  Smooth move tea   Diarrhea:  Kaopectate  Imodium A-D   *NO pepto Bismol   Hemorrhoids:  Anusol  Anusol HC  Preparation H  Tucks   Indigestion:  Tums  Maalox  Mylanta  Zantac  Pepcid   Insomnia:  Benadryl (alcohol free) 25mg  every 6 hours as needed  Tylenol PM  Unisom, no Gelcaps   Leg Cramps:  Tums  MagGel   Nausea/Vomiting:  Bonine  Dramamine  Emetrol  Ginger extract  Sea bands  Meclizine  Nausea medication to take during pregnancy:  Unisom (doxylamine succinate 25 mg tablets) Take one tablet daily at bedtime. If symptoms are not adequately controlled, the dose can be increased to a maximum recommended dose of two tablets daily (1/2 tablet in the morning, 1/2 tablet mid-afternoon and one at bedtime).  Vitamin B6 100mg  tablets. Take one tablet twice a day (up to 200 mg per day).   Skin Rashes:  Aveeno products  Benadryl cream or 25mg  every 6 hours as needed  Calamine Lotion  1% cortisone cream   Yeast infection:  Gyne-lotrimin 7  Monistat 7    **If taking multiple medications, please check labels to avoid duplicating the same active ingredients  **take medication as directed on the label  ** Do not exceed 4000 mg of tylenol in 24 hours  **Do not take medications that contain aspirin or ibuprofen         Deciding about Circumcision in Baby Boys  (The Basics)  What is circumcision?  Circumcision is a surgery that removes the skin that covers the tip of the penis, called the "foreskin"  Circumcision is usually done when a boy is between 82 and 37 days old. In the [place], circumcision is common. In some other countries, fewer boys are circumcised. Circumcision is a common tradition in some religions.  Should I have my baby boy circumcised?  There is no easy answer. Circumcision has some benefits. But it also has risks. After talking with your doctor, you will have to decide for yourself what is right for your family.  What are the benefits of circumcision?  Circumcised boys seem to have slightly lower rates of: ?Urinary tract infections ?Swelling of the opening at the tip of the penis Circumcised men seem to have slightly lower rates of: ?Urinary tract infections ?Swelling of the opening at the tip of the penis ?Penis cancer ?HIV and other infections that you catch during sex ?Cervical cancer in the women they have sex with Even so, in the [place], the risks of these problems are small - even in boys and men who have not been circumcised. Plus, boys and men who are not circumcised can reduce these extra risks by: ?Cleaning their penis well ?Using condoms during sex  What are the risks of circumcision?  Risks include: ?Bleeding or infection from the surgery ?Damage to or amputation of the penis ?A chance that the doctor will cut off too much or not enough of the foreskin ?A chance that sex won't feel as good later in life Only about 1 out of every 200 circumcisions leads to problems. There is also a chance that your health insurance won't pay for circumcision.  How is circumcision done in baby boys?  First, the baby gets medicine for pain relief. This might be a cream on the skin or a shot into the base of the penis. Next, the doctor cleans the baby's penis well. Then he or she uses special tools to cut off the foreskin. Finally, the doctor wraps a bandage (called gauze) around the baby's penis. If you have your baby circumcised, his doctor or nurse will  give you instructions on how to care for him after the surgery. It is important that you follow those instructions carefully.

## 2019-08-28 NOTE — Progress Notes (Signed)
Patient is in the office for NOB, late to care. Pt reports fetal movement, denies pain.

## 2019-08-28 NOTE — Progress Notes (Signed)
   PRENATAL VISIT NOTE  Subjective:  Eileen Morris is a 23 y.o. [phone] at [redacted]w[redacted]d being seen today for her first prenatal visit for this pregnancy.  She is currently monitored for the following issues for this low-risk pregnancy and has Supervision of normal pregnancy on their problem list.  Patient reports no complaints.  Contractions: Not present. Vag. Bleeding: None.  Movement: Present. Denies leaking of fluid.   She is planning to both breat and bottlefeed. Desires POPs for contraception.   The following portions of the patient's history were reviewed and updated as appropriate: allergies, current medications, past family history, past medical history, past social history, past surgical history and problem list.   Objective:   Vitals:   08/28/19 1106  BP: 123/77  Pulse: (!) 106  Temp: 97.9 F (36.6 C)  Weight: 202 lb 6.4 oz (91.8 kg)    Fetal Status: Fetal Heart Rate (bpm): 152(Simultaneous filing. User may not have seen previous data.) Fundal Height: 25 cm Movement: Present     General:  Alert, oriented and cooperative. Patient is in no acute distress.  Skin: Skin is warm and dry. No rash noted.   Cardiovascular: Normal heart rate and rhythm noted  Respiratory: Normal respiratory effort, no problems with respiration noted. Clear to auscultation.   Abdomen: Soft, gravid, appropriate for gestational age. Normal bowel sounds. Non-tender. Pain/Pressure: Absent     Pelvic: Cervical exam performed Dilation: Closed Effacement (%): Thick   Normal cervical contour, no lesions, no bleeding following pap, normal discharge Breast: symmetric. No masses noted. No nipple discharge.   Extremities: Normal range of motion.  Edema: None  Mental Status: Normal mood and affect. Normal behavior. Normal judgment and thought content.   Assessment and Plan:  Pregnancy: G4P1021 at [redacted]w[redacted]d 1. Supervision of other normal pregnancy, antepartum - Culture, OB Urine - Cervicovaginal ancillary only(  [place]) - [place] MFM OB COMP + 14 WK; Future - Babyscripts Schedule Optimization - Obstetric Panel, Including HIV - Flu Vaccine QUAD 36+ mos IM (Fluarix, Quad PF) - Discussed need for 2 hour GTT at next visit   2. Late prenatal care affecting pregnancy, antepartum, second trimester - First visit at 24 weeks   Visit cadence with [facility] discussed  Petra Kuba of our practice with multiple providers discussed   Preterm labor symptoms and general obstetric precautions including but not limited to vaginal bleeding, contractions, leaking of fluid and fetal movement were reviewed in detail with the patient. Please refer to After Visit Summary for other counseling recommendations.   Return in about 4 weeks (around 09/25/2019) for LOB, 28 week labs (fasting), In-Person.  No future appointments.  Kerry Hough, PA-C

## 2019-08-29 LAB — OBSTETRIC PANEL, INCLUDING HIV
Antibody Screen: NEGATIVE
Basophils Absolute: 0 10*3/uL (ref 0.0–0.2)
Basos: 0 %
EOS (ABSOLUTE): 0.1 10*3/uL (ref 0.0–0.4)
Eos: 1 %
HIV Screen 4th Generation wRfx: NONREACTIVE
Hematocrit: 35.8 % (ref 34.0–46.6)
Hemoglobin: 12.3 g/dL (ref 11.1–15.9)
Hepatitis B Surface Ag: NEGATIVE
Immature Grans (Abs): 0.1 10*3/uL (ref 0.0–0.1)
Immature Granulocytes: 1 %
Lymphocytes Absolute: 1.3 10*3/uL (ref 0.7–3.1)
Lymphs: 12 %
MCH: 30.2 pg (ref 26.6–33.0)
MCHC: 34.4 g/dL (ref 31.5–35.7)
MCV: 88 fL (ref 79–97)
Monocytes Absolute: 0.6 10*3/uL (ref 0.1–0.9)
Monocytes: 5 %
Neutrophils Absolute: 8.8 10*3/uL — ABNORMAL HIGH (ref 1.4–7.0)
Neutrophils: 81 %
Platelets: 242 10*3/uL (ref 150–450)
RBC: 4.07 x10E6/uL (ref 3.77–5.28)
RDW: 13 % (ref 11.7–15.4)
RPR Ser Ql: NONREACTIVE
Rh Factor: POSITIVE
Rubella Antibodies, IGG: 1.94 index (ref >0.99)
WBC: 10.9 10*3/uL — ABNORMAL HIGH (ref 3.4–10.8)

## 2019-09-01 LAB — CERVICOVAGINAL ANCILLARY ONLY
Bacterial Vaginitis (gardnerella): NEGATIVE
Candida Glabrata: NEGATIVE
Candida Vaginitis: NEGATIVE
Chlamydia: NEGATIVE
Molecular Disclaimer: NEGATIVE
Molecular Disclaimer: NEGATIVE
Molecular Disclaimer: NEGATIVE
Molecular Disclaimer: NEGATIVE
Molecular Disclaimer: NORMAL
Molecular Disclaimer: NORMAL
Neisseria Gonorrhea: NEGATIVE
Trichomonas: NEGATIVE

## 2019-09-03 LAB — CULTURE, OB URINE

## 2019-09-03 LAB — URINE CULTURE, OB REFLEX

## 2019-09-11 ENCOUNTER — Ambulatory Visit (HOSPITAL_COMMUNITY)
Admission: RE | Admit: 2019-09-11 | Discharge: 2019-09-11 | Disposition: A | Discharge status: Home / Self Care | Payer: Medicaid Other | Source: Ambulatory Visit | Attending: Medical | Admitting: Medical

## 2019-09-11 ENCOUNTER — Other Ambulatory Visit: Payer: Self-pay

## 2019-09-11 ENCOUNTER — Other Ambulatory Visit: Payer: Self-pay | Admitting: Medical

## 2019-09-11 DIAGNOSIS — O99212 Obesity complicating pregnancy, second trimester: Secondary | ICD-10-CM

## 2019-09-11 DIAGNOSIS — Z3480 Encounter for supervision of other normal pregnancy, unspecified trimester: Secondary | ICD-10-CM

## 2019-09-11 DIAGNOSIS — Z3A26 26 weeks gestation of pregnancy: Secondary | ICD-10-CM

## 2019-09-25 ENCOUNTER — Encounter: Payer: Self-pay | Admitting: Certified Nurse Midwife

## 2019-09-25 ENCOUNTER — Ambulatory Visit (INDEPENDENT_AMBULATORY_CARE_PROVIDER_SITE_OTHER): Payer: Medicaid Other | Admitting: Certified Nurse Midwife

## 2019-09-25 ENCOUNTER — Other Ambulatory Visit: Payer: Self-pay

## 2019-09-25 ENCOUNTER — Other Ambulatory Visit: Payer: Medicaid Other

## 2019-09-25 VITALS — BP 115/75 | HR 93 | Wt 206.0 lb

## 2019-09-25 DIAGNOSIS — Z23 Encounter for immunization: Secondary | ICD-10-CM

## 2019-09-25 DIAGNOSIS — O24410 Gestational diabetes mellitus in pregnancy, diet controlled: Secondary | ICD-10-CM

## 2019-09-25 DIAGNOSIS — Z3A28 28 weeks gestation of pregnancy: Secondary | ICD-10-CM

## 2019-09-25 DIAGNOSIS — Z3480 Encounter for supervision of other normal pregnancy, unspecified trimester: Secondary | ICD-10-CM

## 2019-09-25 MED ORDER — COMFORT FIT MATERNITY SUPP LG MISC: 1.0000 [IU] | Freq: Every day | 0 refills | Status: DC

## 2019-09-25 NOTE — Progress Notes (Signed)
ROB with no complaints. Tdap will be given info handout provided.

## 2019-09-25 NOTE — Patient Instructions (Signed)

## 2019-09-25 NOTE — Progress Notes (Signed)
   PRENATAL VISIT NOTE  Subjective:  Eileen Morris is a 23 y.o. [phone] at [redacted]w[redacted]d being seen today for ongoing prenatal care.  She is currently monitored for the following issues for this low-risk pregnancy and has Supervision of normal pregnancy on their problem list.  Patient reports lower abdominal pressure/pain, good fetal mvmt, no other complaints, no cramping, no bleeding.  Contractions: Not present. Vag. Bleeding: None.  Movement: Present. Denies leaking of fluid.   The following portions of the patient's history were reviewed and updated as appropriate: allergies, current medications, past family history, past medical history, past social history, past surgical history and problem list.   Objective:   Vitals:   09/25/19 0846  BP: 115/75  Pulse: 93  Weight: 206 lb (93.4 kg)    Fetal Status: Fetal Heart Rate (bpm): 150 Fundal Height: 30 cm Movement: Present     General:  Alert, oriented and cooperative. Patient is in no acute distress.  Skin: Skin is warm and dry. No rash noted.   Cardiovascular: Normal heart rate noted  Respiratory: Normal respiratory effort, no problems with respiration noted  Abdomen: Soft, gravid, appropriate for gestational age.  Pain/Pressure: Present     Pelvic: Cervical exam deferred, vertex position        Extremities: Normal range of motion.  Edema: None  Mental Status: Normal mood and affect. Normal behavior. Normal judgment and thought content.   Assessment and Plan:  Pregnancy: G4P1021 at [redacted]w[redacted]d 1. Supervision of other normal pregnancy, antepartum Tuyet is progressing appropriately. Her lower abdominal pain is related to fetal positioning and normal MSK stretching during pregnancy. She would benefit from a new pregnancy support belt. Otherwise, she is appropriate for ROB today. - Glucose Tolerance, 2 Hours w/1 Hour - CBC - RPR - HIV Antibody (routine testing w rflx) - Elastic Bandages & Supports (COMFORT FIT MATERNITY SUPP LG) MISC; 1 Units  by Does not apply route daily.  Dispense: 1 each; Refill: 0  2. Need for Tdap vaccination - Tdap vaccine greater than or equal to 7yo IM  Preterm labor symptoms and general obstetric precautions including but not limited to vaginal bleeding, contractions, leaking of fluid and fetal movement were reviewed in detail with the patient. Please refer to After Visit Summary for other counseling recommendations.   Return in about 4 weeks (around 10/23/2019) for ROB- mychart.  No future appointments.  Kathryne Eriksson, Medical Student

## 2019-09-26 DIAGNOSIS — O24410 Gestational diabetes mellitus in pregnancy, diet controlled: Secondary | ICD-10-CM | POA: Insufficient documentation

## 2019-09-26 LAB — HIV ANTIBODY (ROUTINE TESTING W REFLEX): HIV Screen 4th Generation wRfx: NONREACTIVE

## 2019-09-26 LAB — GLUCOSE TOLERANCE, 2 HOURS W/ 1HR
Glucose, 1 hour: 187 mg/dL — ABNORMAL HIGH (ref 65–179)
Glucose, 2 hour: 139 mg/dL (ref 65–152)
Glucose, Fasting: 92 mg/dL — ABNORMAL HIGH (ref 65–91)

## 2019-09-26 LAB — CBC
Hematocrit: 35.6 % (ref 34.0–46.6)
Hemoglobin: 12 g/dL (ref 11.1–15.9)
MCH: 30.2 pg (ref 26.6–33.0)
MCHC: 33.7 g/dL (ref 31.5–35.7)
MCV: 90 fL (ref 79–97)
Platelets: 210 10*3/uL (ref 150–450)
RBC: 3.97 x10E6/uL (ref 3.77–5.28)
RDW: 13.2 % (ref 11.7–15.4)
WBC: 9.4 10*3/uL (ref 3.4–10.8)

## 2019-09-26 LAB — SYPHILIS: RPR W/REFLEX TO RPR TITER AND TREPONEMAL ANTIBODIES, TRADITIONAL SCREENING AND DIAGNOSIS ALGORITHM: RPR Ser Ql: NONREACTIVE

## 2019-09-26 MED ORDER — ACCU-CHEK GUIDE W/DEVICE KIT: 1.0000 | PACK | Freq: Four times a day (QID) | 0 refills | Status: DC

## 2019-09-26 MED ORDER — ACCU-CHEK FASTCLIX LANCETS MISC: 1.0000 [IU] | Freq: Four times a day (QID) | 12 refills | Status: DC

## 2019-09-26 MED ORDER — ACCU-CHEK SMARTVIEW VI STRP: ORAL_STRIP | 12 refills | Status: DC

## 2019-09-26 NOTE — Addendum Note (Signed)
Addended by: Lajean Manes on: 09/26/2019 11:17 AM   Modules accepted: Orders

## 2019-09-28 ENCOUNTER — Other Ambulatory Visit: Payer: Self-pay

## 2019-09-28 DIAGNOSIS — O24410 Gestational diabetes mellitus in pregnancy, diet controlled: Secondary | ICD-10-CM

## 2019-09-28 MED ORDER — GLUCOSE BLOOD VI STRP: ORAL_STRIP | 12 refills | Status: DC

## 2019-09-28 MED ORDER — ACCU-CHEK GUIDE W/DEVICE KIT: 1.0000 | PACK | Freq: Once | Status: AC

## 2019-09-28 MED ORDER — ACCU-CHEK FASTCLIX LANCETS MISC: 12 refills | Status: DC

## 2019-10-22 NOTE — Progress Notes (Signed)
I connected with Eileen Morris on 10/23/19 at 11:00 AM EST by: MyChart and verified that I am speaking with the correct person using two identifiers.  Patient is located at home and provider is located at [facility].     The purpose of this virtual visit is to provide medical care while limiting exposure to the novel coronavirus. I discussed the limitations, risks, security and privacy concerns of performing an evaluation and management service by MyChart and the availability of in person appointments. I also discussed with the patient that there may be a patient responsible charge related to this service. By engaging in this virtual visit, you consent to the provision of healthcare.  Additionally, you authorize for your insurance to be billed for the services provided during this visit.  The patient expressed understanding and agreed to proceed.  The following staff members participated in the virtual visit:  Vernice Jefferson, NP and Hessie Dibble, PA-Student    PRENATAL VISIT NOTE  Subjective:  Eileen Morris is a 23 y.o. [phone] at [redacted]w[redacted]d  for phone visit for ongoing prenatal care.  She is currently monitored for the following issues for this high-risk pregnancy and has Supervision of normal pregnancy and Diet controlled gestational diabetes mellitus (GDM) in third trimester on their problem list.  Patient reports no complaints.  Contractions: Not present. Vag. Bleeding: None.  Movement: Present. Denies leaking of fluid.   The following portions of the patient's history were reviewed and updated as appropriate: allergies, current medications, past family history, past medical history, past social history, past surgical history and problem list.   Objective:  There were no vitals filed for this visit. Patient reports she does not have a BP cuff because she did not fill the prescription, but will pick it up today.  Fetal Status:     Movement: Present     Assessment and Plan:  Pregnancy: G4P1021 at  [redacted]w[redacted]d  1. Encounter for supervision of other normal pregnancy in third trimester -pt to take BP at home after obtaining cuff and notify clinic if >140/90 -discussed circumcision, information given  2. Diet controlled gestational diabetes mellitus (GDM) in third trimester -pt reports she did not find out about her GDM diagnosis until last week as she did not read her message in [place] before this time as she was expecting a phone call, as such, patient has not been checking her blood sugar regularly -pt reports is not on ASA, too late -referral made for nutritional counseling/diabetes education -baseline labs ordered as future order -pt did not have growth scan @28wks , order placed  Preterm labor symptoms and general obstetric precautions including but not limited to vaginal bleeding, contractions, leaking of fluid and fetal movement were reviewed in detail with the patient.  Return in about 2 weeks (around 11/06/2019) for in-person MD one week after DM consult, growth scan ASAP, lab visit ASAP, nutrition consult ASAP.  No future appointments.  Time spent on virtual visit: 15 minutes  Clarisa Fling, NP

## 2019-10-23 ENCOUNTER — Telehealth (INDEPENDENT_AMBULATORY_CARE_PROVIDER_SITE_OTHER): Payer: Medicaid Other | Admitting: Women's Health

## 2019-10-23 DIAGNOSIS — Z3A32 32 weeks gestation of pregnancy: Secondary | ICD-10-CM

## 2019-10-23 DIAGNOSIS — O24410 Gestational diabetes mellitus in pregnancy, diet controlled: Secondary | ICD-10-CM

## 2019-10-23 DIAGNOSIS — Z3483 Encounter for supervision of other normal pregnancy, third trimester: Secondary | ICD-10-CM

## 2019-10-23 NOTE — Progress Notes (Signed)
Pt is unable to check BP today, does not have cuff. Discussed glucose testing and need for N&D appt.

## 2019-10-23 NOTE — Patient Instructions (Addendum)
The Maternity Assessment Unit (MAU) is located at the [facility] at [facility]. The address is: [address] [place], [place], [place] [address]. Please see map below for additional directions.    The Maternity Assessment Unit is designed to help you during your pregnancy, and for up to 6 weeks after delivery, with any pregnancy- or postpartum-related emergencies, if you think you are in labor, or if your water has broken. For example, if you experience nausea and vomiting, vaginal bleeding, severe abdominal or pelvic pain, elevated blood pressure or other problems related to your pregnancy or postpartum time, please come to the Maternity Assessment Unit for assistance.   Third Trimester of Pregnancy The third trimester is from week 28 through week 40 (months 7 through 9). The third trimester is a time when the unborn baby (fetus) is growing rapidly. At the end of the ninth month, the fetus is about 20 inches in length and weighs 6-10 pounds. Body changes during your third trimester Your body will continue to go through many changes during pregnancy. The changes vary from woman to woman. During the third trimester:  Your weight will continue to increase. You can expect to gain 25-35 pounds (11-16 kg) by the end of the pregnancy.  You may begin to get stretch marks on your hips, abdomen, and breasts.  You may urinate more often because the fetus is moving lower into your pelvis and pressing on your bladder.  You may develop or continue to have heartburn. This is caused by increased hormones that slow down muscles in the digestive tract.  You may develop or continue to have constipation because increased hormones slow digestion and cause the muscles that push waste through your intestines to relax.  You may develop hemorrhoids. These are swollen veins (varicose veins) in the rectum that can itch or be painful.  You may develop swollen, bulging veins  (varicose veins) in your legs.  You may have increased body aches in the pelvis, back, or thighs. This is due to weight gain and increased hormones that are relaxing your joints.  You may have changes in your hair. These can include thickening of your hair, rapid growth, and changes in texture. Some women also have hair loss during or after pregnancy, or hair that feels dry or thin. Your hair will most likely return to normal after your baby is born.  Your breasts will continue to grow and they will continue to become tender. A yellow fluid (colostrum) may leak from your breasts. This is the first milk you are producing for your baby.  Your belly button may stick out.  You may notice more swelling in your hands, face, or ankles.  You may have increased tingling or numbness in your hands, arms, and legs. The skin on your belly may also feel numb.  You may feel short of breath because of your expanding uterus.  You may have more problems sleeping. This can be caused by the size of your belly, increased need to urinate, and an increase in your body's metabolism.  You may notice the fetus "dropping," or moving lower in your abdomen (lightening).  You may have increased vaginal discharge.  You may notice your joints feel loose and you may have pain around your pelvic bone. What to expect at prenatal visits You will have prenatal exams every 2 weeks until week 36. Then you will have weekly prenatal exams. During a routine prenatal visit:  You will be weighed  to make sure you and the baby are growing normally.  Your blood pressure will be taken.  Your abdomen will be measured to track your baby's growth.  The fetal heartbeat will be listened to.  Any test results from the previous visit will be discussed.  You may have a cervical check near your due date to see if your cervix has softened or thinned (effaced).  You will be tested for Group B streptococcus. This happens between 35 and  37 weeks. Your health care provider may ask you:  What your birth plan is.  How you are feeling.  If you are feeling the baby move.  If you have had any abnormal symptoms, such as leaking fluid, bleeding, severe headaches, or abdominal cramping.  If you are using any tobacco products, including cigarettes, chewing tobacco, and electronic cigarettes.  If you have any questions. Other tests or screenings that may be performed during your third trimester include:  Blood tests that check for low iron levels (anemia).  Fetal testing to check the health, activity level, and growth of the fetus. Testing is done if you have certain medical conditions or if there are problems during the pregnancy.  Nonstress test (NST). This test checks the health of your baby to make sure there are no signs of problems, such as the baby not getting enough oxygen. During this test, a belt is placed around your belly. The baby is made to move, and its heart rate is monitored during movement. What is false labor? False labor is a condition in which you feel small, irregular tightenings of the muscles in the womb (contractions) that usually go away with rest, changing position, or drinking water. These are called Braxton Hicks contractions. Contractions may last for hours, days, or even weeks before true labor sets in. If contractions come at regular intervals, become more frequent, increase in intensity, or become painful, you should see your health care provider. What are the signs of labor?  Abdominal cramps.  Regular contractions that start at 10 minutes apart and become stronger and more frequent with time.  Contractions that start on the top of the uterus and spread down to the lower abdomen and back.  Increased pelvic pressure and dull back pain.  A watery or bloody mucus discharge that comes from the vagina.  Leaking of amniotic fluid. This is also known as your "water breaking." It could be a slow  trickle or a gush. Let your health care provider know if it has a color or strange odor. If you have any of these signs, call your health care provider right away, even if it is before your due date. Follow these instructions at home: Medicines  Follow your health care provider's instructions regarding medicine use. Specific medicines may be either safe or unsafe to take during pregnancy.  Take a prenatal vitamin that contains at least 600 micrograms (mcg) of folic acid.  If you develop constipation, try taking a stool softener if your health care provider approves. Eating and drinking   Eat a balanced diet that includes fresh fruits and vegetables, whole grains, good sources of protein such as meat, eggs, or tofu, and low-fat dairy. Your health care provider will help you determine the amount of weight gain that is right for you.  Avoid raw meat and uncooked cheese. These carry germs that can cause birth defects in the baby.  If you have low calcium intake from food, talk to your health care provider about whether you  should take a daily calcium supplement.  Eat four or five small meals rather than three large meals a day.  Limit foods that are high in fat and processed sugars, such as fried and sweet foods.  To prevent constipation: ? Drink enough fluid to keep your urine clear or pale yellow. ? Eat foods that are high in fiber, such as fresh fruits and vegetables, whole grains, and beans. Activity  Exercise only as directed by your health care provider. Most women can continue their usual exercise routine during pregnancy. Try to exercise for 30 minutes at least 5 days a week. Stop exercising if you experience uterine contractions.  Avoid heavy lifting.  Do not exercise in extreme heat or humidity, or at high altitudes.  Wear low-heel, comfortable shoes.  Practice good posture.  You may continue to have sex unless your health care provider tells you otherwise. Relieving pain  and discomfort  Take frequent breaks and rest with your legs elevated if you have leg cramps or low back pain.  Take warm sitz baths to soothe any pain or discomfort caused by hemorrhoids. Use hemorrhoid cream if your health care provider approves.  Wear a good support bra to prevent discomfort from breast tenderness.  If you develop varicose veins: ? Wear support pantyhose or compression stockings as told by your healthcare provider. ? Elevate your feet for 15 minutes, 3-4 times a day. Prenatal care  Write down your questions. Take them to your prenatal visits.  Keep all your prenatal visits as told by your health care provider. This is important. Safety  Wear your seat belt at all times when driving.  Make a list of emergency phone numbers, including numbers for family, friends, the hospital, and police and fire departments. General instructions  Avoid cat litter boxes and soil used by cats. These carry germs that can cause birth defects in the baby. If you have a cat, ask someone to clean the litter box for you.  Do not travel far distances unless it is absolutely necessary and only with the approval of your health care provider.  Do not use hot tubs, steam rooms, or saunas.  Do not drink alcohol.  Do not use any products that contain nicotine or tobacco, such as cigarettes and e-cigarettes. If you need help quitting, ask your health care provider.  Do not use any medicinal herbs or unprescribed drugs. These chemicals affect the formation and growth of the baby.  Do not douche or use tampons or scented sanitary pads.  Do not cross your legs for long periods of time.  To prepare for the arrival of your baby: ? Take prenatal classes to understand, practice, and ask questions about labor and delivery. ? Make a trial run to the hospital. ? Visit the hospital and tour the maternity area. ? Arrange for maternity or paternity leave through employers. ? Arrange for family and  friends to take care of pets while you are in the hospital. ? Purchase a rear-facing car seat and make sure you know how to install it in your car. ? Pack your hospital bag. ? Prepare the babys nursery. Make sure to remove all pillows and stuffed animals from the baby's crib to prevent suffocation.  Visit your dentist if you have not gone during your pregnancy. Use a soft toothbrush to brush your teeth and be gentle when you floss. Contact a health care provider if:  You are unsure if you are in labor or if your water has broken.  You become dizzy.  You have mild pelvic cramps, pelvic pressure, or nagging pain in your abdominal area.  You have lower back pain.  You have persistent nausea, vomiting, or diarrhea.  You have an unusual or bad smelling vaginal discharge.  You have pain when you urinate. Get help right away if:  Your water breaks before 37 weeks.  You have regular contractions less than 5 minutes apart before 37 weeks.  You have a fever.  You are leaking fluid from your vagina.  You have spotting or bleeding from your vagina.  You have severe abdominal pain or cramping.  You have rapid weight loss or weight gain.  You have shortness of breath with chest pain.  You notice sudden or extreme swelling of your face, hands, ankles, feet, or legs.  Your baby makes fewer than 10 movements in 2 hours.  You have severe headaches that do not go away when you take medicine.  You have vision changes. Summary  The third trimester is from week 28 through week 40, months 7 through 9. The third trimester is a time when the unborn baby (fetus) is growing rapidly.  During the third trimester, your discomfort may increase as you and your baby continue to gain weight. You may have abdominal, leg, and back pain, sleeping problems, and an increased need to urinate.  During the third trimester your breasts will keep growing and they will continue to become tender. A yellow  fluid (colostrum) may leak from your breasts. This is the first milk you are producing for your baby.  False labor is a condition in which you feel small, irregular tightenings of the muscles in the womb (contractions) that eventually go away. These are called Braxton Hicks contractions. Contractions may last for hours, days, or even weeks before true labor sets in.  Signs of labor can include: abdominal cramps; regular contractions that start at 10 minutes apart and become stronger and more frequent with time; watery or bloody mucus discharge that comes from the vagina; increased pelvic pressure and dull back pain; and leaking of amniotic fluid. This information is not intended to replace advice given to you by your health care provider. Make sure you discuss any questions you have with your health care provider. Document Released: 11/13/2001 Document Revised: 03/12/2019 Document Reviewed: 12/25/2016 Elsevier Patient Education  [place]. Gestational Diabetes Mellitus, Diagnosis Gestational diabetes (gestational diabetes mellitus) is a temporary form of diabetes that some women develop during pregnancy. It usually occurs around weeks 24-28 of pregnancy, and it goes away after delivery. Hormonal changes during pregnancy can interfere with insulin production and function, which may result in one or both of these problems:  The pancreas does not make enough of a hormone called insulin.  Cells in the body do not respond properly to insulin that the body makes (insulin resistance). Normally, insulin allows blood sugar (glucose) to enter cells in the body. The cells use glucose for energy. Insulin resistance or lack of insulin causes excess glucose to build up in the blood instead of going into cells. As a result, high blood glucose (hyperglycemia) develops. If gestational diabetes is treated, it is not likely to cause problems. If it is not controlled with treatment, it may cause problems during  labor and delivery, and some of those problems can be harmful to the unborn baby (fetus) and the mother. Women who get gestational diabetes are more likely to develop it if they get pregnant again, and they are more likely  to develop type 2 diabetes in the future. What increases the risk? This condition may be more likely to develop in pregnant women who:  Are older than age 36 during pregnancy.  Have a family history of diabetes.  Are overweight.  Had gestational diabetes in the past.  Have polycystic ovary syndrome (PCOS).  Are pregnant with twins or multiples.  Are of American-Indian, African-American, Hispanic/Latino, or Asian/Pacific Islander descent. What are the signs or symptoms? Most women do not notice symptoms of gestational diabetes because the symptoms are similar to normal symptoms of pregnancy. Symptoms of gestational diabetes may include:  Increased thirst (polydipsia).  Increased hunger(polyphagia).  Increased urination (polyuria). How is this diagnosed? This condition may be diagnosed based on your blood glucose level, which may be checked with one or more of the following blood tests:  A fasting blood glucose (FBG) test. You will not be allowed to eat (you will fast) for 8 hours or longer before a blood sample is taken.  A random blood glucose test. This checks your blood glucose at any time of day regardless of when you ate.  An oral glucose tolerance test (OGTT). This is usually done during weeks 24-28 of pregnancy. ? For this test, you will have an FBG test done. Then, you will drink a beverage that contains glucose. Your blood glucose will be tested again one hour after you drink the glucose beverage (1-hour OGTT). ? If the 1-hour OGTT result is at or above 140 mg/dL (7.8 mmol/L), you will repeat the OGTT. This time, your blood glucose will be tested 3 hours after you drink the glucose beverage (3-hour OGTT). If you have risk factors, you may be screened  for undiagnosed type 2 diabetes at your first health care visit during your pregnancy (prenatal visit). How is this treated?     Your treatment may be managed by a specialist called an endocrinologist. This condition is treated by following instructions from your health care provider about:  Eating a healthy diet and getting more physical activity. These changes are the most important ways to manage gestational diabetes.  Checking your blood glucose. Do this as often as told.  Taking diabetes medicines or insulin every day. These will only be prescribed if they are needed. ? If you use insulin, you may need to adjust your dosage based on how physically active you are and what foods you eat. Your health care provider will tell you how to do this. Your health care provider will set treatment goals for you based on the stage of your pregnancy and any other medical conditions you have. Generally, the goal of treatment is to maintain the following blood glucose levels during pregnancy:  Before meals (preprandial): at or below 95 mg/dL (5.3 mmol/L).  After meals (postprandial): ? One hour after a meal: at or below 140 mg/dL (7.8 mmol/L). ? Two hours after a meal: at or below 120 mg/dL (6.7 mmol/L).  A1c (hemoglobin A1c) level: 6-6.5%. Follow these instructions at home: Questions to ask your health care provider  Consider asking the following questions: ? Do I need to meet with a diabetes educator? ? What equipment will I need to manage my diabetes at home? ? What diabetes medicines do I need, and when should I take them? ? How often do I need to check my blood glucose? ? What number can I call if I have questions? ? When is my next appointment? General instructions  Take over-the-counter and prescription medicines only as told  by your health care provider.  Manage your weight gain during pregnancy. The amount of weight that you are expected to gain depends on your pre-pregnancy BMI  (body mass index).  Keep all follow-up visits as told by your health care provider. This is important.  For more information about diabetes, visit: ? American Diabetes Association (ADA): www.diabetes.org ? American Association of Diabetes Educators (AADE): www.diabeteseducator.org Contact a health care provider if:  Your blood glucose level is at or above 240 mg/dL (16.1 mmol/L).  Your blood glucose level is at or above 200 mg/dL (09.6 mmol/L) and you have ketones in your urine.  You have been sick or have had a fever for 2 days or longer and you are not getting better.  You have any of the following problems for more than 6 hours: ? You cannot eat or drink. ? You have nausea and vomiting. ? You have diarrhea. Get help right away if:  Your blood glucose is lower than 54 mg/dL (3 mmol/L).  You become confused or you have trouble thinking clearly.  You have difficulty breathing.  You have moderate or large ketone levels in your urine.  Your baby is moving around less than usual.  You develop unusual discharge or bleeding from your vagina.  You start having contractions early (prematurely). Contractions may feel like a tightening in your lower abdomen. Summary  Gestational diabetes (gestational diabetes mellitus) is a temporary form of diabetes that some women develop during pregnancy. It usually occurs around weeks 24-28 of pregnancy, and it goes away after delivery.  This condition is treated by making diet and lifestyle changes and taking diabetes medicines or insulin, if needed.  Women who get gestational diabetes are more likely to develop it if they get pregnant again, and they are more likely to develop type 2 diabetes in the future. This information is not intended to replace advice given to you by your health care provider. Make sure you discuss any questions you have with your health care provider. Document Released: 02/25/2001 Document Revised: 12/26/2017 Document  Reviewed: 12/23/2015 Elsevier Patient Education  2020 [employer].   Places to have your son circumcised:                                                                      [facility]     [phone]   [phone] while you are in hospital         [facility]              [phone]   $269 by 4 wks                      [facility]                     [phone]   $269 by 7 days [facility]                    [phone]   $269 by 4 wks [facility]             [phone]   $225 by 2 wks    These prices sometimes change but are roughly what you can expect to pay. Please call and confirm  pricing.   Circumcision is considered an elective/non-medically necessary procedure. There are many reasons parents decide to have their sons circumsized. During the first year of life circumcised males have a reduced risk of urinary tract infections but after this year the rates between circumcised males and uncircumcised males are the same.  It is safe to have your son circumcised outside of the hospital and the places above perform them regularly.   Deciding about Circumcision in Baby Boys  (Up-to-date The Basics)  What is circumcision?   Circumcision is a surgery that removes the skin that covers the tip of the penis, called the "foreskin" Circumcision is usually done when a boy is between 2 and 83 days old. In the [place], circumcision is common. In some other countries, fewer boys are circumcised. Circumcision is a common tradition in some religions.  Should I have my baby boy circumcised?   There is no easy answer. Circumcision has some benefits. But it also has risks. After talking with your doctor, you will have to decide for yourself what is right for your family.  What are the benefits of circumcision?   Circumcised boys seem to have slightly lower rates of: ?Urinary tract infections ?Swelling of the opening at the tip of the penis Circumcised men seem to have slightly lower rates of: ?Urinary tract  infections ?Swelling of the opening at the tip of the penis ?Penis cancer ?HIV and other infections that you catch during sex ?Cervical cancer in the women they have sex with Even so, in the [place], the risks of these problems are small - even in boys and men who have not been circumcised. Plus, boys and men who are not circumcised can reduce these extra risks by: ?Cleaning their penis well ?Using condoms during sex  What are the risks of circumcision?  Risks include: ?Bleeding or infection from the surgery ?Damage to or amputation of the penis ?A chance that the doctor will cut off too much or not enough of the foreskin ?A chance that sex won't feel as good later in life Only about 1 out of every 200 circumcisions leads to problems. There is also a chance that your health insurance won't pay for circumcision.  How is circumcision done in baby boys?  First, the baby gets medicine for pain relief. This might be a cream on the skin or a shot into the base of the penis. Next, the doctor cleans the baby's penis well. Then he or she uses special tools to cut off the foreskin. Finally, the doctor wraps a bandage (called gauze) around the baby's penis. If you have your baby circumcised, his doctor or nurse will give you instructions on how to care for him after the surgery. It is important that you follow those instructions carefully.  Deciding about Circumcision in Baby Boys  (The Basics)  What is circumcision?  Circumcision is a surgery that removes the skin that covers the tip of the penis, called the "foreskin" Circumcision is usually done when a boy is between 34 and 32 days old. In the [place], circumcision is common. In some other countries, fewer boys are circumcised. Circumcision is a common tradition in some religions.  Should I have my baby boy circumcised?  There is no easy answer. Circumcision has some benefits. But it also has risks. After talking with your  doctor, you will have to decide for yourself what is right for your family.  What are the benefits of circumcision?  Circumcised  boys seem to have slightly lower rates of: ?Urinary tract infections ?Swelling of the opening at the tip of the penis Circumcised men seem to have slightly lower rates of: ?Urinary tract infections ?Swelling of the opening at the tip of the penis ?Penis cancer ?HIV and other infections that you catch during sex ?Cervical cancer in the women they have sex with Even so, in the [place], the risks of these problems are small - even in boys and men who have not been circumcised. Plus, boys and men who are not circumcised can reduce these extra risks by: ?Cleaning their penis well ?Using condoms during sex  What are the risks of circumcision?  Risks include: ?Bleeding or infection from the surgery ?Damage to or amputation of the penis ?A chance that the doctor will cut off too much or not enough of the foreskin ?A chance that sex won't feel as good later in life Only about 1 out of every 200 circumcisions leads to problems. There is also a chance that your health insurance won't pay for circumcision.  How is circumcision done in baby boys?  First, the baby gets medicine for pain relief. This might be a cream on the skin or a shot into the base of the penis. Next, the doctor cleans the baby's penis well. Then he or she uses special tools to cut off the foreskin. Finally, the doctor wraps a bandage (called gauze) around the baby's penis. If you have your baby circumcised, his doctor or nurse will give you instructions on how to care for him after the surgery. It is important that you follow those instructions carefully.   Preterm Labor and Birth Information  The normal length of a pregnancy is 39-41 weeks. Preterm labor is when labor starts before 37 completed weeks of pregnancy. What are the risk factors for preterm labor? Preterm labor is more likely to  occur in women who:  Have certain infections during pregnancy such as a bladder infection, sexually transmitted infection, or infection inside the uterus (chorioamnionitis).  Have a shorter-than-normal cervix.  Have gone into preterm labor before.  Have had surgery on their cervix.  Are younger than age 43 or older than age 41.  Are African American.  Are pregnant with twins or multiple babies (multiple gestation).  Take street drugs or smoke while pregnant.  Do not gain enough weight while pregnant.  Became pregnant shortly after having been pregnant. What are the symptoms of preterm labor? Symptoms of preterm labor include:  Cramps similar to those that can happen during a menstrual period. The cramps may happen with diarrhea.  Pain in the abdomen or lower back.  Regular uterine contractions that may feel like tightening of the abdomen.  A feeling of increased pressure in the pelvis.  Increased watery or bloody mucus discharge from the vagina.  Water breaking (ruptured amniotic sac). Why is it important to recognize signs of preterm labor? It is important to recognize signs of preterm labor because babies who are born prematurely may not be fully developed. This can put them at an increased risk for:  Long-term (chronic) heart and lung problems.  Difficulty immediately after birth with regulating body systems, including blood sugar, body temperature, heart rate, and breathing rate.  Bleeding in the brain.  Cerebral palsy.  Learning difficulties.  Death. These risks are highest for babies who are born before 34 weeks of pregnancy. How is preterm labor treated? Treatment depends on the length of your pregnancy, your condition, and  the health of your baby. It may involve:  Having a stitch (suture) placed in your cervix to prevent your cervix from opening too early (cerclage).  Taking or being given medicines, such as: ? Hormone medicines. These may be given  early in pregnancy to help support the pregnancy. ? Medicine to stop contractions. ? Medicines to help mature the babys lungs. These may be prescribed if the risk of delivery is high. ? Medicines to prevent your baby from developing cerebral palsy. If the labor happens before 34 weeks of pregnancy, you may need to stay in the hospital. What should I do if I think I am in preterm labor? If you think that you are going into preterm labor, call your health care provider right away. How can I prevent preterm labor in future pregnancies? To increase your chance of having a full-term pregnancy:  Do not use any tobacco products, such as cigarettes, chewing tobacco, and e-cigarettes. If you need help quitting, ask your health care provider.  Do not use street drugs or medicines that have not been prescribed to you during your pregnancy.  Talk with your health care provider before taking any herbal supplements, even if you have been taking them regularly.  Make sure you gain a healthy amount of weight during your pregnancy.  Watch for infection. If you think that you might have an infection, get it checked right away.  Make sure to tell your health care provider if you have gone into preterm labor before. This information is not intended to replace advice given to you by your health care provider. Make sure you discuss any questions you have with your health care provider. Document Released: 02/09/2004 Document Revised: 03/13/2019 Document Reviewed: 04/11/2016 Elsevier Patient Education  2020 [employer]

## 2019-10-28 ENCOUNTER — Telehealth: Payer: Self-pay | Admitting: *Deleted

## 2019-10-28 NOTE — Telephone Encounter (Signed)
Pt had called to office stating she had gotten her BP cuff and checked her BP and it was 144/77.  Attempt to return call to pt.  LM on VM to have pt call office, question if she is having HA's, visual changes or increase in swelling.

## 2019-11-02 ENCOUNTER — Encounter: Payer: Self-pay | Admitting: Obstetrics and Gynecology

## 2019-11-02 ENCOUNTER — Other Ambulatory Visit: Payer: Self-pay

## 2019-11-02 ENCOUNTER — Ambulatory Visit (INDEPENDENT_AMBULATORY_CARE_PROVIDER_SITE_OTHER): Payer: Medicaid Other | Admitting: Obstetrics and Gynecology

## 2019-11-02 VITALS — BP 117/80 | HR 101 | Wt 207.8 lb

## 2019-11-02 DIAGNOSIS — R519 Headache, unspecified: Secondary | ICD-10-CM

## 2019-11-02 DIAGNOSIS — Z3A33 33 weeks gestation of pregnancy: Secondary | ICD-10-CM

## 2019-11-02 DIAGNOSIS — O24410 Gestational diabetes mellitus in pregnancy, diet controlled: Secondary | ICD-10-CM

## 2019-11-02 DIAGNOSIS — Z3483 Encounter for supervision of other normal pregnancy, third trimester: Secondary | ICD-10-CM

## 2019-11-02 DIAGNOSIS — O26893 Other specified pregnancy related conditions, third trimester: Secondary | ICD-10-CM

## 2019-11-02 NOTE — Progress Notes (Signed)
   PRENATAL VISIT NOTE  Subjective:  Eileen Morris is a 23 y.o. [id number] at [redacted]w[redacted]d being seen today for ongoing prenatal care.  She is currently monitored for the following issues for this high-risk pregnancy and has Supervision of normal pregnancy and Diet controlled gestational diabetes mellitus (GDM) in third trimester on their problem list.  Patient reports headache. States she takes tylenol and it is not helping much. Has completely cut out caffeine due to diabetes.  Contractions: Not present. Vag. Bleeding: None.  Movement: Present. Denies leaking of fluid.   The following portions of the patient's history were reviewed and updated as appropriate: allergies, current medications, past family history, past medical history, past social history, past surgical history and problem list.   Objective:   Vitals:   11/02/19 1453  BP: 117/80  Pulse: (!) 101  Weight: 207 lb 12.8 oz (94.3 kg)    Fetal Status: Fetal Heart Rate (bpm): 144   Movement: Present     General:  Alert, oriented and cooperative. Patient is in no acute distress.  Skin: Skin is warm and dry. No rash noted.   Cardiovascular: Normal heart rate noted  Respiratory: Normal respiratory effort, no problems with respiration noted  Abdomen: Soft, gravid, appropriate for gestational age.  Pain/Pressure: Absent     Pelvic: Cervical exam deferred        Extremities: Normal range of motion.  Edema: None  Mental Status: Normal mood and affect. Normal behavior. Normal judgment and thought content.   Assessment and Plan:  Pregnancy: G4P1021 at [redacted]w[redacted]d  1. Encounter for supervision of other normal pregnancy in third trimester  2. Diet controlled gestational diabetes mellitus (GDM) in third trimester Diet controlled, doing well on diet FG: under 92 PP: all under 140 F/u growth 11/06/19  3. Pregnancy headache in third trimester Likely 2/2 cutting out caffeine May take tylenol  Preterm labor symptoms and general obstetric  precautions including but not limited to vaginal bleeding, contractions, leaking of fluid and fetal movement were reviewed in detail with the patient. Please refer to After Visit Summary for other counseling recommendations.   Return in about 2 weeks (around 11/16/2019) for high OB, virtual.  Future Appointments  Date Time Provider [place]  11/06/2019  1:15 PM [place] [facility]  11/06/2019  1:15 PM [place] 4 [facility]    Sloan Leiter, MD

## 2019-11-02 NOTE — Progress Notes (Signed)
Patient reports fetal movement with occasional headaches. Pt reports headache since yesterday despite taking headache, aggravated by light. Pt reports fasting BG 91 today.

## 2019-11-06 ENCOUNTER — Encounter (HOSPITAL_COMMUNITY): Payer: Self-pay

## 2019-11-06 ENCOUNTER — Ambulatory Visit (HOSPITAL_COMMUNITY): Payer: Medicaid Other | Admitting: *Deleted

## 2019-11-06 ENCOUNTER — Other Ambulatory Visit (HOSPITAL_COMMUNITY): Payer: Self-pay | Admitting: *Deleted

## 2019-11-06 ENCOUNTER — Other Ambulatory Visit: Payer: Self-pay

## 2019-11-06 ENCOUNTER — Ambulatory Visit (HOSPITAL_COMMUNITY)
Admission: RE | Admit: 2019-11-06 | Discharge: 2019-11-06 | Disposition: A | Discharge status: Home / Self Care | Payer: Medicaid Other | Source: Ambulatory Visit | Attending: Obstetrics and Gynecology | Admitting: Obstetrics and Gynecology

## 2019-11-06 VITALS — BP 129/74 | HR 95 | Temp 97.3°F

## 2019-11-06 DIAGNOSIS — O99213 Obesity complicating pregnancy, third trimester: Secondary | ICD-10-CM

## 2019-11-06 DIAGNOSIS — O24410 Gestational diabetes mellitus in pregnancy, diet controlled: Secondary | ICD-10-CM | POA: Diagnosis not present

## 2019-11-06 DIAGNOSIS — Z362 Encounter for other antenatal screening follow-up: Secondary | ICD-10-CM

## 2019-11-06 DIAGNOSIS — Z3A34 34 weeks gestation of pregnancy: Secondary | ICD-10-CM

## 2019-11-06 DIAGNOSIS — O0933 Supervision of pregnancy with insufficient antenatal care, third trimester: Secondary | ICD-10-CM | POA: Diagnosis not present

## 2019-11-16 ENCOUNTER — Telehealth: Payer: Medicaid Other | Admitting: Obstetrics and Gynecology

## 2019-11-18 ENCOUNTER — Other Ambulatory Visit: Payer: Self-pay

## 2019-11-18 ENCOUNTER — Ambulatory Visit: Payer: Medicaid Other | Admitting: Registered"

## 2019-11-18 ENCOUNTER — Encounter: Payer: Self-pay | Admitting: Family Medicine

## 2019-11-18 ENCOUNTER — Telehealth (INDEPENDENT_AMBULATORY_CARE_PROVIDER_SITE_OTHER): Payer: Medicaid Other | Admitting: Family Medicine

## 2019-11-18 VITALS — BP 125/69 | HR 104

## 2019-11-18 DIAGNOSIS — Z3483 Encounter for supervision of other normal pregnancy, third trimester: Secondary | ICD-10-CM

## 2019-11-18 DIAGNOSIS — Z3A35 35 weeks gestation of pregnancy: Secondary | ICD-10-CM

## 2019-11-18 DIAGNOSIS — O24410 Gestational diabetes mellitus in pregnancy, diet controlled: Secondary | ICD-10-CM

## 2019-11-18 NOTE — Progress Notes (Signed)
Virtual Visit via Telephone Note  I connected with Eileen Morris on 11/18/19 at  1:35 PM EST by telephone and verified that I am speaking with the correct person using two identifiers.  ROB pt c/o low back pain worse when moving from laying to sitting up postion 8/10 Fasting <92, <140 postprandial

## 2019-11-18 NOTE — Progress Notes (Signed)
Patient ID: Eileen Morris, female   DOB: 07-03-1996, 23 y.o.   MRN: [id number]  I connected with@ on 11/18/19 at  1:35 PM EST by: MyChart and verified that I am speaking with the correct person using two identifiers.  Patient is located at home and provider is located at [facility].     The purpose of this virtual visit is to provide medical care while limiting exposure to the novel coronavirus. I discussed the limitations, risks, security and privacy concerns of performing an evaluation and management service by Mychart and the availability of in person appointments. I also discussed with the patient that there may be a patient responsible charge related to this service. By engaging in this virtual visit, you consent to the provision of healthcare.  Additionally, you authorize for your insurance to be billed for the services provided during this visit.  The patient expressed understanding and agreed to proceed.  The following staff members participated in the virtual visit:  Clarnce Flock, MD    PRENATAL VISIT NOTE  Subjective:  Eileen Morris is a 23 y.o. [id number] at [redacted]w[redacted]d  for phone visit for ongoing prenatal care.  She is currently monitored for the following issues for this high-risk pregnancy and has Supervision of normal pregnancy and Diet controlled gestational diabetes mellitus (GDM) in third trimester on their problem list.  Patient reports no complaints.  Contractions: Not present. Vag. Bleeding: None.  Movement: Present. Denies leaking of fluid.   The following portions of the patient's history were reviewed and updated as appropriate: allergies, current medications, past family history, past medical history, past social history, past surgical history and problem list.   Objective:   Vitals:   11/18/19 1335  BP: 125/69  Pulse: (!) 104   Self-Obtained  Fetal Status:     Movement: Present     Assessment and Plan:  Pregnancy: G4P1021 at [redacted]w[redacted]d 1. Diet controlled  gestational diabetes mellitus (GDM) in third trimester Patient reports that all fastings <92, all 2hr PP <140 Has not been logging her numbers, says she has not really looked into [employer] Asked her to please log all her values Antenatal growth scans normal, most recently on 11/06/2019  2. Encounter for supervision of other normal pregnancy in third trimester RTC in 1 week to review sugar log, for swabs  Preterm labor symptoms and general obstetric precautions including but not limited to vaginal bleeding, contractions, leaking of fluid and fetal movement were reviewed in detail with the patient.  Return in 1 week (on 11/25/2019) for [facility], ob visit, in person.  Future Appointments  Date Time Provider [place]  12/07/2019  2:15 PM [place] 4 [facility]  12/07/2019  2:20 PM [place] [facility]     Time spent on virtual visit: 11 minutes  Clarnce Flock, MD

## 2019-11-18 NOTE — Patient Instructions (Signed)
Group B Streptococcus Infection During Pregnancy  Group B Streptococcus (GBS) is a type of bacteria (Streptococcus agalactiae) that is often found in healthy people, commonly in the rectum, vagina, and intestines. In people who are healthy and not pregnant, the bacteria rarely cause serious illness or complications. However, women who test positive for GBS during pregnancy can pass the bacteria to their baby during childbirth, which can cause serious infection in the baby after birth. Women with GBS may also have infections during their pregnancy or immediately after childbirth, such as urinary tract infections (UTIs) or infections of the uterus (uterine infections). Having GBS also increases a woman's risk of complications during pregnancy, such as early (preterm) labor or delivery, miscarriage, or stillbirth. Routine testing (screening) for GBS is recommended for all pregnant women. What increases the risk? You may have a higher risk for GBS infection during pregnancy if you had one during a past pregnancy. What are the signs or symptoms? In most cases, GBS infection does not cause symptoms in pregnant women. Signs and symptoms of a possible GBS-related infection may include:  Labor starting before the 37th week of pregnancy.  A UTI or bladder infection, which may cause: ? Fever. ? Pain or burning during urination. ? Frequent urination.  Fever during labor, along with: ? Bad-smelling discharge. ? Uterine tenderness. ? Rapid heartbeat in the mother, baby, or both. Rare but serious symptoms of a possible GBS-related infection in women include:  Blood infection (septicemia). This may cause fever, chills, or confusion.  Lung infection (pneumonia). This may cause fever, chills, cough, rapid breathing, difficulty breathing, or chest pain.  Bone, joint, skin, or soft tissue infection. How is this diagnosed? You may be screened for GBS between week 35 and week 37 of your pregnancy. If you  have symptoms of preterm labor, you may be screened earlier. This condition is diagnosed based on lab test results from:  A swab of fluid from the vagina and rectum.  A urine sample. How is this treated? This condition is treated with antibiotic medicine. When you go into labor, or as soon as your water breaks (your membranes rupture), you will be given antibiotics through an IV tube. Antibiotics will continue until after you give birth. If you are having a cesarean delivery, you do not need antibiotics unless your membranes have already ruptured. Follow these instructions at home:  Take over-the-counter and prescription medicines only as told by your health care provider.  Take your antibiotic medicine as told by your health care provider. Do not stop taking the antibiotic even if you start to feel better.  Keep all pre-birth (prenatal) visits and follow-up visits as told by your health care provider. This is important. Contact a health care provider if:  You have pain or burning when you urinate.  You have to urinate frequently.  You have a fever or chills.  You develop a bad-smelling vaginal discharge. Get help right away if:  Your membranes rupture.  You go into labor.  You have severe pain in your abdomen.  You have difficulty breathing.  You have chest pain. This information is not intended to replace advice given to you by your health care provider. Make sure you discuss any questions you have with your health care provider. Document Released: 02/26/2008 Document Revised: 03/12/2019 Document Reviewed: 06/14/2016 Elsevier Patient Education  2020 [employer]   Contraception Choices Contraception, also called birth control, refers to methods or devices that prevent pregnancy. Hormonal methods Contraceptive implant  A  contraceptive implant is a thin, plastic tube that contains a hormone. It is inserted into the upper part of the arm. It can remain in place for up to  3 years. Progestin-only injections Progestin-only injections are injections of progestin, a synthetic form of the hormone progesterone. They are given every 3 months by a health care provider. Birth control pills  Birth control pills are pills that contain hormones that prevent pregnancy. They must be taken once a day, preferably at the same time each day. Birth control patch  The birth control patch contains hormones that prevent pregnancy. It is placed on the skin and must be changed once a week for three weeks and removed on the fourth week. A prescription is needed to use this method of contraception. Vaginal ring  A vaginal ring contains hormones that prevent pregnancy. It is placed in the vagina for three weeks and removed on the fourth week. After that, the process is repeated with a new ring. A prescription is needed to use this method of contraception. Emergency contraceptive Emergency contraceptives prevent pregnancy after unprotected sex. They come in pill form and can be taken up to 5 days after sex. They work best the sooner they are taken after having sex. Most emergency contraceptives are available without a prescription. This method should not be used as your only form of birth control. Barrier methods Female condom  A female condom is a thin sheath that is worn over the penis during sex. Condoms keep sperm from going inside a woman's body. They can be used with a spermicide to increase their effectiveness. They should be disposed after a single use. Female condom  A female condom is a soft, loose-fitting sheath that is put into the vagina before sex. The condom keeps sperm from going inside a woman's body. They should be disposed after a single use. Diaphragm  A diaphragm is a soft, dome-shaped barrier. It is inserted into the vagina before sex, along with a spermicide. The diaphragm blocks sperm from entering the uterus, and the spermicide kills sperm. A diaphragm should be left  in the vagina for 6-8 hours after sex and removed within 24 hours. A diaphragm is prescribed and fitted by a health care provider. A diaphragm should be replaced every 1-2 years, after giving birth, after gaining more than 15 lb (6.8 kg), and after pelvic surgery. Cervical cap  A cervical cap is a round, soft latex or plastic cup that fits over the cervix. It is inserted into the vagina before sex, along with spermicide. It blocks sperm from entering the uterus. The cap should be left in place for 6-8 hours after sex and removed within 48 hours. A cervical cap must be prescribed and fitted by a health care provider. It should be replaced every 2 years. Sponge  A sponge is a soft, circular piece of polyurethane foam with spermicide on it. The sponge helps block sperm from entering the uterus, and the spermicide kills sperm. To use it, you make it wet and then insert it into the vagina. It should be inserted before sex, left in for at least 6 hours after sex, and removed and thrown away within 30 hours. Spermicides Spermicides are chemicals that kill or block sperm from entering the cervix and uterus. They can come as a cream, jelly, suppository, foam, or tablet. A spermicide should be inserted into the vagina with an applicator at least 10-15 minutes before sex to allow time for it to work. The process must  be repeated every time you have sex. Spermicides do not require a prescription. Intrauterine contraception Intrauterine device (IUD) An IUD is a T-shaped device that is put in a woman's uterus. There are two types:  Hormone IUD.This type contains progestin, a synthetic form of the hormone progesterone. This type can stay in place for 3-5 years.  Copper IUD.This type is wrapped in copper wire. It can stay in place for 10 years.  Permanent methods of contraception Female tubal ligation In this method, a woman's fallopian tubes are sealed, tied, or blocked during surgery to prevent eggs from  traveling to the uterus. Hysteroscopic sterilization In this method, a small, flexible insert is placed into each fallopian tube. The inserts cause scar tissue to form in the fallopian tubes and block them, so sperm cannot reach an egg. The procedure takes about 3 months to be effective. Another form of birth control must be used during those 3 months. Female sterilization This is a procedure to tie off the tubes that carry sperm (vasectomy). After the procedure, the man can still ejaculate fluid (semen). Natural planning methods Natural family planning In this method, a couple does not have sex on days when the woman could become pregnant. Calendar method This means keeping track of the length of each menstrual cycle, identifying the days when pregnancy can happen, and not having sex on those days. Ovulation method In this method, a couple avoids sex during ovulation. Symptothermal method This method involves not having sex during ovulation. The woman typically checks for ovulation by watching changes in her temperature and in the consistency of cervical mucus. Post-ovulation method In this method, a couple waits to have sex until after ovulation. Summary  Contraception, also called birth control, means methods or devices that prevent pregnancy.  Hormonal methods of contraception include implants, injections, pills, patches, vaginal rings, and emergency contraceptives.  Barrier methods of contraception can include female condoms, female condoms, diaphragms, cervical caps, sponges, and spermicides.  There are two types of IUDs (intrauterine devices). An IUD can be put in a woman's uterus to prevent pregnancy for 3-5 years.  Permanent sterilization can be done through a procedure for males, females, or both.  Natural family planning methods involve not having sex on days when the woman could become pregnant. This information is not intended to replace advice given to you by your health care  provider. Make sure you discuss any questions you have with your health care provider. Document Released: 11/19/2005 Document Revised: 11/21/2017 Document Reviewed: 12/22/2016 Elsevier Patient Education  2020 [employer].   Breastfeeding  Choosing to breastfeed is one of the best decisions you can make for yourself and your baby. A change in hormones during pregnancy causes your breasts to make breast milk in your milk-producing glands. Hormones prevent breast milk from being released before your baby is born. They also prompt milk flow after birth. Once breastfeeding has begun, thoughts of your baby, as well as his or her sucking or crying, can stimulate the release of milk from your milk-producing glands. Benefits of breastfeeding Research shows that breastfeeding offers many health benefits for infants and mothers. It also offers a cost-free and convenient way to feed your baby. For your baby  Your first milk (colostrum) helps your baby's digestive system to function better.  Special cells in your milk (antibodies) help your baby to fight off infections.  Breastfed babies are less likely to develop asthma, allergies, obesity, or type 2 diabetes. They are also at lower risk for  sudden infant death syndrome (SIDS).  Nutrients in breast milk are better able to meet your baby's needs compared to infant formula.  Breast milk improves your baby's brain development. For you  Breastfeeding helps to create a very special bond between you and your baby.  Breastfeeding is convenient. Breast milk costs nothing and is always available at the correct temperature.  Breastfeeding helps to burn calories. It helps you to lose the weight that you gained during pregnancy.  Breastfeeding makes your uterus return faster to its size before pregnancy. It also slows bleeding (lochia) after you give birth.  Breastfeeding helps to lower your risk of developing type 2 diabetes, osteoporosis, rheumatoid  arthritis, cardiovascular disease, and breast, ovarian, uterine, and endometrial cancer later in life. Breastfeeding basics Starting breastfeeding  Find a comfortable place to sit or lie down, with your neck and back well-supported.  Place a pillow or a rolled-up blanket under your baby to bring him or her to the level of your breast (if you are seated). Nursing pillows are specially designed to help support your arms and your baby while you breastfeed.  Make sure that your baby's tummy (abdomen) is facing your abdomen.  Gently massage your breast. With your fingertips, massage from the outer edges of your breast inward toward the nipple. This encourages milk flow. If your milk flows slowly, you may need to continue this action during the feeding.  Support your breast with 4 fingers underneath and your thumb above your nipple (make the letter "C" with your hand). Make sure your fingers are well away from your nipple and your baby's mouth.  Stroke your baby's lips gently with your finger or nipple.  When your baby's mouth is open wide enough, quickly bring your baby to your breast, placing your entire nipple and as much of the areola as possible into your baby's mouth. The areola is the colored area around your nipple. ? More areola should be visible above your baby's upper lip than below the lower lip. ? Your baby's lips should be opened and extended outward (flanged) to ensure an adequate, comfortable latch. ? Your baby's tongue should be between his or her lower gum and your breast.  Make sure that your baby's mouth is correctly positioned around your nipple (latched). Your baby's lips should create a seal on your breast and be turned out (everted).  It is common for your baby to suck about 2-3 minutes in order to start the flow of breast milk. Latching Teaching your baby how to latch onto your breast properly is very important. An improper latch can cause nipple pain, decreased milk  supply, and poor weight gain in your baby. Also, if your baby is not latched onto your nipple properly, he or she may swallow some air during feeding. This can make your baby fussy. Burping your baby when you switch breasts during the feeding can help to get rid of the air. However, teaching your baby to latch on properly is still the best way to prevent fussiness from swallowing air while breastfeeding. Signs that your baby has successfully latched onto your nipple  Silent tugging or silent sucking, without causing you pain. Infant's lips should be extended outward (flanged).  Swallowing heard between every 3-4 sucks once your milk has started to flow (after your let-down milk reflex occurs).  Muscle movement above and in front of his or her ears while sucking. Signs that your baby has not successfully latched onto your nipple  Sucking sounds or  smacking sounds from your baby while breastfeeding.  Nipple pain. If you think your baby has not latched on correctly, slip your finger into the corner of your baby's mouth to break the suction and place it between your baby's gums. Attempt to start breastfeeding again. Signs of successful breastfeeding Signs from your baby  Your baby will gradually decrease the number of sucks or will completely stop sucking.  Your baby will fall asleep.  Your baby's body will relax.  Your baby will retain a small amount of milk in his or her mouth.  Your baby will let go of your breast by himself or herself. Signs from you  Breasts that have increased in firmness, weight, and size 1-3 hours after feeding.  Breasts that are softer immediately after breastfeeding.  Increased milk volume, as well as a change in milk consistency and color by the fifth day of breastfeeding.  Nipples that are not sore, cracked, or bleeding. Signs that your baby is getting enough milk  Wetting at least 1-2 diapers during the first 24 hours after birth.  Wetting at least 5-6  diapers every 24 hours for the first week after birth. The urine should be clear or pale yellow by the age of 5 days.  Wetting 6-8 diapers every 24 hours as your baby continues to grow and develop.  At least 3 stools in a 24-hour period by the age of 5 days. The stool should be soft and yellow.  At least 3 stools in a 24-hour period by the age of 7 days. The stool should be seedy and yellow.  No loss of weight greater than 10% of birth weight during the first 3 days of life.  Average weight gain of 4-7 oz (113-198 g) per week after the age of 4 days.  Consistent daily weight gain by the age of 5 days, without weight loss after the age of 2 weeks. After a feeding, your baby may spit up a small amount of milk. This is normal. Breastfeeding frequency and duration Frequent feeding will help you make more milk and can prevent sore nipples and extremely full breasts (breast engorgement). Breastfeed when you feel the need to reduce the fullness of your breasts or when your baby shows signs of hunger. This is called "breastfeeding on demand." Signs that your baby is hungry include:  Increased alertness, activity, or restlessness.  Movement of the head from side to side.  Opening of the mouth when the corner of the mouth or cheek is stroked (rooting).  Increased sucking sounds, smacking lips, cooing, sighing, or squeaking.  Hand-to-mouth movements and sucking on fingers or hands.  Fussing or crying. Avoid introducing a pacifier to your baby in the first 4-6 weeks after your baby is born. After this time, you may choose to use a pacifier. Research has shown that pacifier use during the first year of a baby's life decreases the risk of sudden infant death syndrome (SIDS). Allow your baby to feed on each breast as long as he or she wants. When your baby unlatches or falls asleep while feeding from the first breast, offer the second breast. Because newborns are often sleepy in the first few weeks of  life, you may need to awaken your baby to get him or her to feed. Breastfeeding times will vary from baby to baby. However, the following rules can serve as a guide to help you make sure that your baby is properly fed:  Newborns (babies 43 weeks of age or  younger) may breastfeed every 1-3 hours.  Newborns should not go without breastfeeding for longer than 3 hours during the day or 5 hours during the night.  You should breastfeed your baby a minimum of 8 times in a 24-hour period. Breast milk pumping     Pumping and storing breast milk allows you to make sure that your baby is exclusively fed your breast milk, even at times when you are unable to breastfeed. This is especially important if you go back to work while you are still breastfeeding, or if you are not able to be present during feedings. Your lactation consultant can help you find a method of pumping that works best for you and give you guidelines about how long it is safe to store breast milk. Caring for your breasts while you breastfeed Nipples can become dry, cracked, and sore while breastfeeding. The following recommendations can help keep your breasts moisturized and healthy:  Avoid using soap on your nipples.  Wear a supportive bra designed especially for nursing. Avoid wearing underwire-style bras or extremely tight bras (sports bras).  Air-dry your nipples for 3-4 minutes after each feeding.  Use only cotton bra pads to absorb leaked breast milk. Leaking of breast milk between feedings is normal.  Use lanolin on your nipples after breastfeeding. Lanolin helps to maintain your skin's normal moisture barrier. Pure lanolin is not harmful (not toxic) to your baby. You may also hand express a few drops of breast milk and gently massage that milk into your nipples and allow the milk to air-dry. In the first few weeks after giving birth, some women experience breast engorgement. Engorgement can make your breasts feel heavy, warm,  and tender to the touch. Engorgement peaks within 3-5 days after you give birth. The following recommendations can help to ease engorgement:  Completely empty your breasts while breastfeeding or pumping. You may want to start by applying warm, moist heat (in the shower or with warm, water-soaked hand towels) just before feeding or pumping. This increases circulation and helps the milk flow. If your baby does not completely empty your breasts while breastfeeding, pump any extra milk after he or she is finished.  Apply ice packs to your breasts immediately after breastfeeding or pumping, unless this is too uncomfortable for you. To do this: ? Put ice in a plastic bag. ? Place a towel between your skin and the bag. ? Leave the ice on for 20 minutes, 2-3 times a day.  Make sure that your baby is latched on and positioned properly while breastfeeding. If engorgement persists after 48 hours of following these recommendations, contact your health care provider or a Advertising copywriter. Overall health care recommendations while breastfeeding  Eat 3 healthy meals and 3 snacks every day. Well-nourished mothers who are breastfeeding need an additional 450-500 calories a day. You can meet this requirement by increasing the amount of a balanced diet that you eat.  Drink enough water to keep your urine pale yellow or clear.  Rest often, relax, and continue to take your prenatal vitamins to prevent fatigue, stress, and low vitamin and mineral levels in your body (nutrient deficiencies).  Do not use any products that contain nicotine or tobacco, such as cigarettes and e-cigarettes. Your baby may be harmed by chemicals from cigarettes that pass into breast milk and exposure to secondhand smoke. If you need help quitting, ask your health care provider.  Avoid alcohol.  Do not use illegal drugs or marijuana.  Talk with your health  care provider before taking any medicines. These include over-the-counter and  prescription medicines as well as vitamins and herbal supplements. Some medicines that may be harmful to your baby can pass through breast milk.  It is possible to become pregnant while breastfeeding. If birth control is desired, ask your health care provider about options that will be safe while breastfeeding your baby. Where to find more information: [employer]: www.llli.org Contact a health care provider if:  You feel like you want to stop breastfeeding or have become frustrated with breastfeeding.  Your nipples are cracked or bleeding.  Your breasts are red, tender, or warm.  You have: ? Painful breasts or nipples. ? A swollen area on either breast. ? A fever or chills. ? Nausea or vomiting. ? Drainage other than breast milk from your nipples.  Your breasts do not become full before feedings by the fifth day after you give birth.  You feel sad and depressed.  Your baby is: ? Too sleepy to eat well. ? Having trouble sleeping. ? More than 77 week old and wetting fewer than 6 diapers in a 24-hour period. ? Not gaining weight by 23 days of age.  Your baby has fewer than 3 stools in a 24-hour period.  Your baby's skin or the white parts of his or her eyes become yellow. Get help right away if:  Your baby is overly tired (lethargic) and does not want to wake up and feed.  Your baby develops an unexplained fever. Summary  Breastfeeding offers many health benefits for infant and mothers.  Try to breastfeed your infant when he or she shows early signs of hunger.  Gently tickle or stroke your baby's lips with your finger or nipple to allow the baby to open his or her mouth. Bring the baby to your breast. Make sure that much of the areola is in your baby's mouth. Offer one side and burp the baby before you offer the other side.  Talk with your health care provider or lactation consultant if you have questions or you face problems as you breastfeed. This  information is not intended to replace advice given to you by your health care provider. Make sure you discuss any questions you have with your health care provider. Document Released: 11/19/2005 Document Revised: 02/13/2018 Document Reviewed: 12/21/2016 Elsevier Patient Education  2020 [employer].

## 2019-11-25 ENCOUNTER — Other Ambulatory Visit (HOSPITAL_COMMUNITY)
Admission: RE | Admit: 2019-11-25 | Discharge: 2019-11-25 | Disposition: A | Discharge status: Home / Self Care | Payer: Medicaid Other | Source: Ambulatory Visit | Attending: Obstetrics and Gynecology | Admitting: Obstetrics and Gynecology

## 2019-11-25 ENCOUNTER — Ambulatory Visit (INDEPENDENT_AMBULATORY_CARE_PROVIDER_SITE_OTHER): Payer: Medicaid Other | Admitting: Obstetrics and Gynecology

## 2019-11-25 ENCOUNTER — Other Ambulatory Visit: Payer: Self-pay

## 2019-11-25 ENCOUNTER — Encounter: Payer: Self-pay | Admitting: Obstetrics and Gynecology

## 2019-11-25 VITALS — BP 115/71 | HR 90 | Temp 98.7°F | Wt 210.9 lb

## 2019-11-25 DIAGNOSIS — Z3483 Encounter for supervision of other normal pregnancy, third trimester: Secondary | ICD-10-CM | POA: Insufficient documentation

## 2019-11-25 DIAGNOSIS — O24410 Gestational diabetes mellitus in pregnancy, diet controlled: Secondary | ICD-10-CM

## 2019-11-25 DIAGNOSIS — Z3A36 36 weeks gestation of pregnancy: Secondary | ICD-10-CM

## 2019-11-25 NOTE — Progress Notes (Signed)
GBS/GC/CT due today Pt forgot to bring CBG log book but states numbers are WNL.

## 2019-11-25 NOTE — Progress Notes (Signed)
   PRENATAL VISIT NOTE  Subjective:  Eileen Morris is a 23 y.o. [phone] at [redacted]w[redacted]d being seen today for ongoing prenatal care.  She is currently monitored for the following issues for this high-risk pregnancy and has Supervision of normal pregnancy and Diet controlled gestational diabetes mellitus (GDM) in third trimester on their problem list.  Patient reports some pressure.  Contractions: Irritability. Vag. Bleeding: None.  Movement: Present. Denies leaking of fluid.   The following portions of the patient's history were reviewed and updated as appropriate: allergies, current medications, past family history, past medical history, past social history, past surgical history and problem list.   Objective:   Vitals:   11/25/19 0854  BP: 115/71  Pulse: 90  Temp: 98.7 F (37.1 C)  Weight: 210 lb 14.4 oz (95.7 kg)    Fetal Status: Fetal Heart Rate (bpm): 143   Movement: Present     General:  Alert, oriented and cooperative. Patient is in no acute distress.  Skin: Skin is warm and dry. No rash noted.   Cardiovascular: Normal heart rate noted  Respiratory: Normal respiratory effort, no problems with respiration noted  Abdomen: Soft, gravid, appropriate for gestational age.  Pain/Pressure: Present     Pelvic: Cervical exam deferred        Extremities: Normal range of motion.  Edema: Trace  Mental Status: Normal mood and affect. Normal behavior. Normal judgment and thought content.   Assessment and Plan:  Pregnancy: G4P1021 at [redacted]w[redacted]d  1. Encounter for supervision of other normal pregnancy in third trimester - Strep Gp B NAA - Cervicovaginal ancillary only( [place]) - Reviewed hospital policies regarding visitors, masking, COVID testing, etc.  2. Diet controlled gestational diabetes mellitus (GDM) in third trimester Last growth appropriate Next growth 12/06/18 FG: 89-90 PP: 115    Preterm labor symptoms and general obstetric precautions including but not limited to vaginal  bleeding, contractions, leaking of fluid and fetal movement were reviewed in detail with the patient. Please refer to After Visit Summary for other counseling recommendations.   Return in about 1 week (around 12/02/2019) for high OB, virtual.  Future Appointments  Date Time Provider [place]  12/07/2019  2:15 PM [place] 4 WH-MFCUS MFC-US  12/07/2019  2:20 PM [place] MFC-US    Sloan Leiter, MD

## 2019-11-26 LAB — CERVICOVAGINAL ANCILLARY ONLY
Chlamydia: NEGATIVE
Comment: NEGATIVE
Comment: NORMAL
Neisseria Gonorrhea: NEGATIVE

## 2019-11-27 LAB — STREP GP B NAA: Strep Gp B NAA: NEGATIVE

## 2019-12-02 ENCOUNTER — Encounter: Payer: Self-pay | Admitting: Obstetrics and Gynecology

## 2019-12-02 ENCOUNTER — Telehealth (INDEPENDENT_AMBULATORY_CARE_PROVIDER_SITE_OTHER): Payer: Medicaid Other | Admitting: Obstetrics and Gynecology

## 2019-12-02 ENCOUNTER — Other Ambulatory Visit: Payer: Self-pay

## 2019-12-02 VITALS — BP 124/68 | HR 88

## 2019-12-02 DIAGNOSIS — Z3483 Encounter for supervision of other normal pregnancy, third trimester: Secondary | ICD-10-CM

## 2019-12-02 DIAGNOSIS — Z3A37 37 weeks gestation of pregnancy: Secondary | ICD-10-CM

## 2019-12-02 DIAGNOSIS — O24410 Gestational diabetes mellitus in pregnancy, diet controlled: Secondary | ICD-10-CM

## 2019-12-02 NOTE — Progress Notes (Signed)
Pt is on the phone preparing for virtual visit with provider. [redacted]w[redacted]d.

## 2019-12-02 NOTE — Progress Notes (Signed)
TELEHEALTH OBSTETRICS PRENATAL VIRTUAL VIDEO VISIT ENCOUNTER NOTE  Provider location: [facility] [place]   I connected with Eileen Morris on 12/02/19 at 10:30 AM EST by MyChart Video Encounter at home and verified that I am speaking with the correct person using two identifiers.   I discussed the limitations, risks, security and privacy concerns of performing an evaluation and management service virtually and the availability of in person appointments. I also discussed with the patient that there may be a patient responsible charge related to this service. The patient expressed understanding and agreed to proceed. Subjective:  Eileen Morris is a 23 y.o. [phone] at [redacted]w[redacted]d being seen today for ongoing prenatal care.  She is currently monitored for the following issues for this high-risk pregnancy and has Supervision of normal pregnancy and Diet controlled gestational diabetes mellitus (GDM) in third trimester on their problem list.  Patient reports no complaints.   .  .   . Denies any leaking of fluid.   The following portions of the patient's history were reviewed and updated as appropriate: allergies, current medications, past family history, past medical history, past social history, past surgical history and problem list.   Objective:  There were no vitals filed for this visit.  Fetal Status:           General:  Alert, oriented and cooperative. Patient is in no acute distress.  Respiratory: Normal respiratory effort, no problems with respiration noted  Mental Status: Normal mood and affect. Normal behavior. Normal judgment and thought content.  Rest of physical exam deferred due to type of encounter  Imaging: [place] MFM OB FOLLOW UP  Result Date: 11/06/2019 ----------------------------------------------------------------------  OBSTETRICS REPORT                       (Signed Final 11/06/2019 02:10 pm)  ---------------------------------------------------------------------- Patient Info  ID #:       [id number]                          D.O.B.:  Oct 09, 1996 (23 yrs)  Name:       Eileen Morris               Visit Date: 11/06/2019 01:13 pm ---------------------------------------------------------------------- Performed By  Performed By:     Ramond Craver Tester BS,       Ref. Address:     [address]                                                             Road                                                             Ste [phone]  [place]                                                             [place]  Attending:        Tama High MD        [facility]  Referred By:      [facility] ---------------------------------------------------------------------- Orders   #  Description                          Code         Ordered By   1  [place] MFM OB FOLLOW UP                  [phone]     NICOLE NUGENT  ----------------------------------------------------------------------   #  Order #                    Accession #                 Episode #   1  [id number]                  [id number]                  [id number]  ---------------------------------------------------------------------- Indications   [redacted] weeks gestation of pregnancy                G1W.29   Obesity complicating pregnancy, third          O99.213   trimester (BMI 34)   Gestational diabetes in pregnancy, diet        O24.410   controlled   Late to prenatal care, third trimester         O09.33  ---------------------------------------------------------------------- Vital Signs                                                 Height:        5'4" ---------------------------------------------------------------------- Fetal Evaluation  Num Of Fetuses:         1  Fetal Heart Rate(bpm):  157   Cardiac Activity:       Observed  Presentation:           Cephalic  Placenta:               Anterior  P. Cord Insertion:      Previously Visualized  Amniotic Fluid  AFI FV:      Within normal limits  AFI Sum(cm)     %Tile       Largest Pocket(cm)  10.77           25          3.29  RUQ(cm)       RLQ(cm)       LUQ(cm)        LLQ(cm)  2.35          2.67          2.46           3.29 ---------------------------------------------------------------------- Biometry  BPD:        87  mm     G. Age:  35w 1d         79  %    CI:        81.54   %    70 - 86                                                          FL/HC:      20.5   %    19.4 - 21.8  HC:      304.1  mm     G. Age:  33w 6d         13  %    HC/AC:      0.99        0.96 - 1.11  AC:      306.5  mm     G. Age:  34w 4d         71  %    FL/BPD:     71.6   %    71 - 87  FL:       62.3  mm     G. Age:  32w 2d          7  %    FL/AC:      20.3   %    20 - 24  HUM:      55.5  mm     G. Age:  32w 2d         22  %  CER:      47.5  mm     G. Age:  N/A          > 95  %  LV:        3.4  mm  Est. FW:    2309  gm      5 lb 1 oz     41  % ---------------------------------------------------------------------- OB History  Gravidity:    4         Term:   1         SAB:   2  Living:       1 ---------------------------------------------------------------------- Gestational Age  LMP:           34w 0d        Date:  03/13/19                 EDD:   12/18/19  U/S Today:     34w 0d                                        EDD:   12/18/19  Best:          34w 0d     Det. By:  LMP  (03/13/19)  EDD:   12/18/19 ---------------------------------------------------------------------- Anatomy  Cranium:               Appears normal         Aortic Arch:            Appears normal  Cavum:                 Appears normal         Ductal Arch:            Appears normal  Ventricles:            Appears normal         Diaphragm:              Appears normal  Choroid Plexus:        Previously seen         Stomach:                Appears normal, left                                                                        sided  Cerebellum:            Previously seen        Abdomen:                Previously seen  Posterior Fossa:       Previously seen        Abdominal Wall:         Previously seen  Nuchal Fold:           Not applicable (>20    Cord Vessels:           Previously seen                         wks GA)  Face:                  Orbits and profile     Kidneys:                Appear normal                         previously seen  Lips:                  Previously seen        Bladder:                Appears normal  Thoracic:              Previously seen        Spine:                  Limited views  previously seen  Heart:                 Appears normal         Upper Extremities:      Previously seen                         (4CH, axis, and                         situs)  RVOT:                  Appears normal         Lower Extremities:      Previously seen  LVOT:                  Appears normal  Other:  Female gender previously seen. Heels and Left 5th digit visualized          previously. Technically difficult due to advanced GA and fetal position. ---------------------------------------------------------------------- Cervix Uterus Adnexa  Cervix  Not visualized (advanced GA >24wks)  Uterus  No abnormality visualized.  Left Ovary  Within normal limits. No adnexal mass visualized.  Right Ovary  Within normal limits. No adnexal mass visualized.  Cul De Sac  No free fluid seen.  Adnexa  No abnormality visualized. ---------------------------------------------------------------------- Impression  Gestational diabetes. Well-controlled on diet.  Amniotic fluid is normal and good fetal activity is seen. Fetal  growth is appropriate for gestational age. ---------------------------------------------------------------------- Recommendations  -An appointment  was made for her to return in 4 weeks for  fetal growth assessment. ----------------------------------------------------------------------                  Noralee Space, MD Electronically Signed Final Report   11/06/2019 02:10 pm ----------------------------------------------------------------------   Assessment and Plan:  Pregnancy: [id number] at [redacted]w[redacted]d 1. Encounter for supervision of other normal pregnancy in third trimester Patient is doing well without complaints Discussed plan for IOL by [facility] if not delivered. To be further discussed at her next appointment. Patient desires cervical check  2. Diet controlled gestational diabetes mellitus (GDM) in third trimester Follow up growth ultrasound 12/07/2019 CBG reviewed and majority within range. Highest fasting 95 and and highest pp 126  Term labor symptoms and general obstetric precautions including but not limited to vaginal bleeding, contractions, leaking of fluid and fetal movement were reviewed in detail with the patient. I discussed the assessment and treatment plan with the patient. The patient was provided an opportunity to ask questions and all were answered. The patient agreed with the plan and demonstrated an understanding of the instructions. The patient was advised to call back or seek an in-person office evaluation/go to [facility]U at [facility] for any urgent or concerning symptoms. Please refer to After Visit Summary for other counseling recommendations.   I provided 11 minutes of face-to-face time during this encounter.  Return in about 1 week (around 12/09/2019) for in person, ROB, High risk.  Future Appointments  Date Time Provider Department Center  12/02/2019 10:30 AM Jaelen Soth, Gigi Gin, MD [facility] None  12/07/2019  2:15 PM WH-MFC [place] 4 WH-MFCUS MFC-US  12/07/2019  2:20 PM [facility]-MFC NURSE WH-MFC MFC-US    Catalina Antigua, MD [facility], [facility]

## 2019-12-04 NOTE — L&D Delivery Note (Signed)
OB/GYN Faculty Practice Delivery Note  Eileen Morris is a 24 y.o. [phone] s/p SVD at [redacted]w[redacted]d. She was admitted for IOL due to GDM.   ROM: 2h 30m with clear fluid GBS Status:  --Eileen Morris (12/23 4099)  Labor Progress: . Patient presented to L&D for IOL for GDM. Initial SVE: 2.5. She then progressed to complete with Cytotec, pitocin, and SROM.   Delivery Date/Time: 1753 Delivery: Called to room and patient was complete and pushing. Head delivered without difficulty. No nuchal cord present. Shoulder delivery with gentle downward traction and body delivered in usual fashion. Infant with spontaneous cry, placed on mother's abdomen, dried and stimulated. Cord clamped x 2 after 1-minute delay, and cut by FOB. Cord blood drawn. Placenta delivered spontaneously with gentle cord traction. Fundus firm with massage and Pitocin. Labia, perineum, vagina, and cervix inspected inspected, 1st degree vaginal laceration present but was hemostatic, no repair needed.  Mother and baby stable and bonding.   Placenta: Sent to L&D Complications: None Lacerations: 1st degree that was hemostatic, no repair EBL: 100 Analgesia: Epidural   Infant: APGAR (1 MIN): 9   APGAR (5 MINS): 9   APGAR (10 MINS):     Sherlyn Lees, [name], RNC-OB

## 2019-12-07 ENCOUNTER — Ambulatory Visit (HOSPITAL_COMMUNITY)
Admission: RE | Admit: 2019-12-07 | Discharge: 2019-12-07 | Disposition: A | Discharge status: Home / Self Care | Payer: Medicaid Other | Source: Ambulatory Visit | Attending: Obstetrics and Gynecology | Admitting: Obstetrics and Gynecology

## 2019-12-07 ENCOUNTER — Ambulatory Visit (HOSPITAL_COMMUNITY): Payer: Medicaid Other | Admitting: *Deleted

## 2019-12-07 ENCOUNTER — Encounter (HOSPITAL_COMMUNITY): Payer: Self-pay

## 2019-12-07 ENCOUNTER — Other Ambulatory Visit: Payer: Self-pay

## 2019-12-07 VITALS — BP 139/70 | HR 100 | Temp 97.3°F

## 2019-12-07 DIAGNOSIS — O0933 Supervision of pregnancy with insufficient antenatal care, third trimester: Secondary | ICD-10-CM | POA: Diagnosis not present

## 2019-12-07 DIAGNOSIS — O99213 Obesity complicating pregnancy, third trimester: Secondary | ICD-10-CM | POA: Diagnosis not present

## 2019-12-07 DIAGNOSIS — Z362 Encounter for other antenatal screening follow-up: Secondary | ICD-10-CM | POA: Diagnosis not present

## 2019-12-07 DIAGNOSIS — O24410 Gestational diabetes mellitus in pregnancy, diet controlled: Secondary | ICD-10-CM

## 2019-12-07 DIAGNOSIS — Z3A38 38 weeks gestation of pregnancy: Secondary | ICD-10-CM

## 2019-12-09 ENCOUNTER — Other Ambulatory Visit: Payer: Self-pay

## 2019-12-09 ENCOUNTER — Encounter: Payer: Self-pay | Admitting: Obstetrics and Gynecology

## 2019-12-09 ENCOUNTER — Other Ambulatory Visit: Payer: Self-pay | Admitting: Advanced Practice Midwife

## 2019-12-09 ENCOUNTER — Ambulatory Visit (INDEPENDENT_AMBULATORY_CARE_PROVIDER_SITE_OTHER): Payer: Medicaid Other | Admitting: Obstetrics and Gynecology

## 2019-12-09 VITALS — BP 123/70 | HR 98 | Wt 215.5 lb

## 2019-12-09 DIAGNOSIS — Z3A38 38 weeks gestation of pregnancy: Secondary | ICD-10-CM

## 2019-12-09 DIAGNOSIS — O24410 Gestational diabetes mellitus in pregnancy, diet controlled: Secondary | ICD-10-CM

## 2019-12-09 DIAGNOSIS — Z3483 Encounter for supervision of other normal pregnancy, third trimester: Secondary | ICD-10-CM

## 2019-12-09 NOTE — Progress Notes (Signed)
Subjective:  Eileen Morris is a 24 y.o. [phone] at [redacted]w[redacted]d being seen today for ongoing prenatal care.  She is currently monitored for the following issues for this high-risk pregnancy and has Supervision of normal pregnancy and Diet controlled gestational diabetes mellitus (GDM) in third trimester on their problem list.  Patient reports general discomforts of pregnancy.  Contractions: Irritability. Vag. Bleeding: None.  Movement: Present. Denies leaking of fluid.   The following portions of the patient's history were reviewed and updated as appropriate: allergies, current medications, past family history, past medical history, past social history, past surgical history and problem list. Problem list updated.  Objective:   Vitals:   12/09/19 1517  BP: 123/70  Pulse: 98  Weight: 215 lb 8 oz (97.8 kg)    Fetal Status: Fetal Heart Rate (bpm): 153   Movement: Present     General:  Alert, oriented and cooperative. Patient is in no acute distress.  Skin: Skin is warm and dry. No rash noted.   Cardiovascular: Normal heart rate noted  Respiratory: Normal respiratory effort, no problems with respiration noted  Abdomen: Soft, gravid, appropriate for gestational age. Pain/Pressure: Present     Pelvic:  Cervical exam performed        Extremities: Normal range of motion.  Edema: Trace  Mental Status: Normal mood and affect. Normal behavior. Normal judgment and thought content.   Urinalysis:      Assessment and Plan:  Pregnancy: G4P1021 at [redacted]w[redacted]d  1. Encounter for supervision of other normal pregnancy in third trimester Stable Labor precautions  2. Diet controlled gestational diabetes mellitus (GDM) in third trimester CBG's stable  IOL scheduled  Term labor symptoms and general obstetric precautions including but not limited to vaginal bleeding, contractions, leaking of fluid and fetal movement were reviewed in detail with the patient. Please refer to After Visit Summary for other  counseling recommendations.  No follow-ups on file.   Hermina Staggers, MD

## 2019-12-09 NOTE — Patient Instructions (Signed)

## 2019-12-09 NOTE — Progress Notes (Signed)
ROB.  C/o pressure and irregular contractions.

## 2019-12-10 ENCOUNTER — Telehealth (HOSPITAL_COMMUNITY): Payer: Self-pay | Admitting: *Deleted

## 2019-12-10 ENCOUNTER — Other Ambulatory Visit (HOSPITAL_COMMUNITY): Payer: Self-pay | Admitting: Advanced Practice Midwife

## 2019-12-10 NOTE — Telephone Encounter (Signed)
Preadmission screen  

## 2019-12-12 ENCOUNTER — Other Ambulatory Visit (HOSPITAL_COMMUNITY)
Admission: RE | Admit: 2019-12-12 | Discharge: 2019-12-12 | Disposition: A | Discharge status: Home / Self Care | Payer: Medicaid Other | Source: Ambulatory Visit | Attending: Family Medicine | Admitting: Family Medicine

## 2019-12-12 DIAGNOSIS — Z01812 Encounter for preprocedural laboratory examination: Secondary | ICD-10-CM | POA: Diagnosis present

## 2019-12-12 DIAGNOSIS — Z20822 Contact with and (suspected) exposure to covid-19: Secondary | ICD-10-CM | POA: Insufficient documentation

## 2019-12-12 LAB — SARS CORONAVIRUS 2 (TAT 6-24 HRS): SARS Coronavirus 2: NEGATIVE

## 2019-12-13 ENCOUNTER — Encounter (HOSPITAL_COMMUNITY): Payer: Self-pay | Admitting: Obstetrics and Gynecology

## 2019-12-13 ENCOUNTER — Inpatient Hospital Stay (HOSPITAL_COMMUNITY)
Admission: AD | Admit: 2019-12-13 | Discharge: 2019-12-13 | Disposition: A | Discharge status: Home / Self Care | Payer: Medicaid Other | Attending: Obstetrics and Gynecology | Admitting: Obstetrics and Gynecology

## 2019-12-13 DIAGNOSIS — O471 False labor at or after 37 completed weeks of gestation: Secondary | ICD-10-CM | POA: Diagnosis not present

## 2019-12-13 DIAGNOSIS — Z3689 Encounter for other specified antenatal screening: Secondary | ICD-10-CM | POA: Insufficient documentation

## 2019-12-13 DIAGNOSIS — Z3A39 39 weeks gestation of pregnancy: Secondary | ICD-10-CM | POA: Insufficient documentation

## 2019-12-13 NOTE — MAU Note (Signed)
I have communicated with Ma Hillock, CNM and reviewed vital signs:  Vitals:   12/13/19 0650 12/13/19 0837  BP: 123/78 (!) 106/54  Pulse: 98   Resp:    Temp:    SpO2: 98%     Vaginal exam:  Dilation: 3 Effacement (%): 60 Cervical Position: Middle Station: -3 Presentation: Vertex Exam by:: TLYTLE RN ,   Also reviewed contraction pattern and that non-stress test is reactive.  It has been documented that patient is contracting every 4-6  minutes with minimal cervical change over 1 1/4 hours not indicating active labor.  Patient denies any other complaints.  Based on this report provider has given order for discharge.  A discharge order and diagnosis entered by a provider.   Labor discharge instructions reviewed with patient.

## 2019-12-13 NOTE — MAU Note (Addendum)
Patient presents to MAU c/o of ctx q4-5 mins. +FM, denies vaginal bleeding or LOF. Patient reports increase in white, thick, mucous vaginal discharge.

## 2019-12-13 NOTE — Discharge Instructions (Signed)
Braxton Hicks Contractions °Contractions of the uterus can occur throughout pregnancy, but they are not always a sign that you are in labor. You may have practice contractions called Braxton Hicks contractions. These false labor contractions are sometimes confused with true labor. °What are Braxton Hicks contractions? °Braxton Hicks contractions are tightening movements that occur in the muscles of the uterus before labor. Unlike true labor contractions, these contractions do not result in opening (dilation) and thinning of the cervix. Toward the end of pregnancy (32-34 weeks), Braxton Hicks contractions can happen more often and may become stronger. These contractions are sometimes difficult to tell apart from true labor because they can be very uncomfortable. You should not feel embarrassed if you go to the hospital with false labor. °Sometimes, the only way to tell if you are in true labor is for your health care provider to look for changes in the cervix. The health care provider will do a physical exam and may monitor your contractions. If you are not in true labor, the exam should show that your cervix is not dilating and your water has not broken. °If there are no other health problems associated with your pregnancy, it is completely safe for you to be sent home with false labor. You may continue to have Braxton Hicks contractions until you go into true labor. °How to tell the difference between true labor and false labor °True labor °· Contractions last 30-70 seconds. °· Contractions become very regular. °· Discomfort is usually felt in the top of the uterus, and it spreads to the lower abdomen and low back. °· Contractions do not go away with walking. °· Contractions usually become more intense and increase in frequency. °· The cervix dilates and gets thinner. °False labor °· Contractions are usually shorter and not as strong as true labor contractions. °· Contractions are usually irregular. °· Contractions  are often felt in the front of the lower abdomen and in the groin. °· Contractions may go away when you walk around or change positions while lying down. °· Contractions get weaker and are shorter-lasting as time goes on. °· The cervix usually does not dilate or become thin. °Follow these instructions at home: ° °· Take over-the-counter and prescription medicines only as told by your health care provider. °· Keep up with your usual exercises and follow other instructions from your health care provider. °· Eat and drink lightly if you think you are going into labor. °· If Braxton Hicks contractions are making you uncomfortable: °? Change your position from lying down or resting to walking, or change from walking to resting. °? Sit and rest in a tub of warm water. °? Drink enough fluid to keep your urine pale yellow. Dehydration may cause these contractions. °? Do slow and deep breathing several times an hour. °· Keep all follow-up prenatal visits as told by your health care provider. This is important. °Contact a health care provider if: °· You have a fever. °· You have continuous pain in your abdomen. °Get help right away if: °· Your contractions become stronger, more regular, and closer together. °· You have fluid leaking or gushing from your vagina. °· You pass blood-tinged mucus (bloody show). °· You have bleeding from your vagina. °· You have low back pain that you never had before. °· You feel your baby’s head pushing down and causing pelvic pressure. °· Your baby is not moving inside you as much as it used to. °Summary °· Contractions that occur before labor are   called Braxton Hicks contractions, false labor, or practice contractions. °· Braxton Hicks contractions are usually shorter, weaker, farther apart, and less regular than true labor contractions. True labor contractions usually become progressively stronger and regular, and they become more frequent. °· Manage discomfort from Braxton Hicks contractions  by changing position, resting in a warm bath, drinking plenty of water, or practicing deep breathing. °This information is not intended to replace advice given to you by your health care provider. Make sure you discuss any questions you have with your health care provider. °Document Revised: 11/01/2017 Document Reviewed: 04/04/2017 °Elsevier Patient Education © 2020 [employer] ° °

## 2019-12-13 NOTE — MAU Provider Note (Signed)
S: Ms. Eileen Morris is a 24 y.o. [phone] at [redacted]w[redacted]d  who presents to MAU today complaining contractions q 4-5  minutes since 0500. She denies vaginal bleeding. She denies LOF. She reports normal fetal movement.    O: BP 123/78   Pulse 98   Temp 97.8 F (36.6 C)   Resp 16   Ht 5\' 4"  (1.626 m)   Wt 97.7 kg   LMP 03/13/2019   SpO2 98%   BMI 36.97 kg/m  GENERAL: Well-developed, well-nourished female in no acute distress.  HEAD: Normocephalic, atraumatic.  CHEST: Normal effort of breathing, regular heart rate ABDOMEN: Soft, nontender, gravid  Cervical exam:  Dilation: 3 Effacement (%): 60 Cervical Position: Middle Station: -3 Presentation: Vertex Exam by:: TLYTLE RN    Fetal Monitoring: Baseline: 135 Variability: moderate Accelerations: 15x15 Decelerations: none Contractions: 4-8   A: SIUP at [redacted]w[redacted]d  False labor  P: -Discharge home in stable condition -Labor precautions discussed -Patient advised to follow-up with [facility] as scheduled for prenatal care -Patient may return to MAU as needed or if her condition were to change or worsen  [redacted]w[redacted]d, Rolm Bookbinder 12/13/2019 8:11 AM

## 2019-12-14 ENCOUNTER — Inpatient Hospital Stay (HOSPITAL_COMMUNITY): Payer: Medicaid Other

## 2019-12-15 ENCOUNTER — Inpatient Hospital Stay (HOSPITAL_COMMUNITY): Payer: Medicaid Other

## 2019-12-15 ENCOUNTER — Inpatient Hospital Stay (HOSPITAL_COMMUNITY): Payer: Medicaid Other | Admitting: Anesthesiology

## 2019-12-15 ENCOUNTER — Other Ambulatory Visit: Payer: Self-pay

## 2019-12-15 ENCOUNTER — Encounter (HOSPITAL_COMMUNITY): Payer: Self-pay | Admitting: Obstetrics and Gynecology

## 2019-12-15 ENCOUNTER — Inpatient Hospital Stay (HOSPITAL_COMMUNITY)
Admission: AD | Admit: 2019-12-15 | Discharge: 2019-12-16 | DRG: 807 | Disposition: A | Discharge status: Home / Self Care | Payer: Medicaid Other | Attending: Family Medicine | Admitting: Family Medicine

## 2019-12-15 DIAGNOSIS — Z3A39 39 weeks gestation of pregnancy: Secondary | ICD-10-CM

## 2019-12-15 DIAGNOSIS — Z8632 Personal history of gestational diabetes: Secondary | ICD-10-CM | POA: Diagnosis present

## 2019-12-15 DIAGNOSIS — O24420 Gestational diabetes mellitus in childbirth, diet controlled: Secondary | ICD-10-CM | POA: Diagnosis present

## 2019-12-15 DIAGNOSIS — O24419 Gestational diabetes mellitus in pregnancy, unspecified control: Secondary | ICD-10-CM | POA: Diagnosis present

## 2019-12-15 HISTORY — DX: Other specified postprocedural states: Z98.890

## 2019-12-15 LAB — CBC
HCT: 39 % (ref 36.0–46.0)
Hemoglobin: 13 g/dL (ref 12.0–15.0)
MCH: 31 pg (ref 26.0–34.0)
MCHC: 33.3 g/dL (ref 30.0–36.0)
MCV: 92.9 fL (ref 80.0–100.0)
Platelets: 204 10*3/uL (ref 150–400)
RBC: 4.2 MIL/uL (ref 3.87–5.11)
RDW: 13.9 % (ref 11.5–15.5)
WBC: 10.9 10*3/uL — ABNORMAL HIGH (ref 4.0–10.5)
nRBC: 0 % (ref 0.0–0.2)

## 2019-12-15 LAB — TYPE AND SCREEN
ABO/RH(D): A POS
Antibody Screen: NEGATIVE

## 2019-12-15 LAB — GLUCOSE, CAPILLARY
Glucose-Capillary: 118 mg/dL — ABNORMAL HIGH (ref 70–99)
Glucose-Capillary: 99 mg/dL (ref 70–99)
Glucose-Capillary: 106 mg/dL — ABNORMAL HIGH (ref 70–99)

## 2019-12-15 LAB — ABO/RH: ABO/RH(D): A POS

## 2019-12-15 LAB — RPR: RPR Ser Ql: NONREACTIVE

## 2019-12-15 MED ORDER — PRENATAL MULTIVITAMIN CH
1.0000 | ORAL_TABLET | Freq: Every day | ORAL | Status: DC
  Administered 2019-12-16: 1 via ORAL
  Filled 2019-12-15: qty 1

## 2019-12-15 MED ORDER — ACETAMINOPHEN 325 MG PO TABS
650.0000 mg | ORAL_TABLET | ORAL | Status: DC | PRN
  Administered 2019-12-15 – 2019-12-16 (×4): 650 mg via ORAL
  Filled 2019-12-15 (×4): qty 2

## 2019-12-15 MED ORDER — PHENYLEPHRINE 40 MCG/ML (10ML) SYRINGE FOR IV PUSH (FOR BLOOD PRESSURE SUPPORT): 80.0000 ug | PREFILLED_SYRINGE | INTRAVENOUS | Status: DC | PRN

## 2019-12-15 MED ORDER — LACTATED RINGERS IV SOLN
500.0000 mL | Freq: Once | INTRAVENOUS | Status: AC
  Administered 2019-12-15: 15:00:00 500 mL via INTRAVENOUS

## 2019-12-15 MED ORDER — FENTANYL CITRATE (PF) 100 MCG/2ML IJ SOLN
50.0000 ug | INTRAMUSCULAR | Status: DC | PRN
  Administered 2019-12-15: 16:00:00 100 ug via INTRAVENOUS
  Filled 2019-12-15: qty 2

## 2019-12-15 MED ORDER — SOD CITRATE-CITRIC ACID 500-334 MG/5ML PO SOLN: 30.0000 mL | ORAL | Status: DC | PRN

## 2019-12-15 MED ORDER — ACETAMINOPHEN 325 MG PO TABS: 650.0000 mg | ORAL_TABLET | ORAL | Status: DC | PRN

## 2019-12-15 MED ORDER — EPHEDRINE 5 MG/ML INJ: 10.0000 mg | INTRAVENOUS | Status: DC | PRN

## 2019-12-15 MED ORDER — LACTATED RINGERS IV SOLN: INTRAVENOUS | Status: DC

## 2019-12-15 MED ORDER — ONDANSETRON HCL 4 MG/2ML IJ SOLN: 4.0000 mg | Freq: Four times a day (QID) | INTRAMUSCULAR | Status: DC | PRN

## 2019-12-15 MED ORDER — OXYTOCIN 40 UNITS IN NORMAL SALINE INFUSION - SIMPLE MED
1.0000 m[IU]/min | INTRAVENOUS | Status: DC
  Administered 2019-12-15: 2 m[IU]/min via INTRAVENOUS
  Filled 2019-12-15: qty 1000

## 2019-12-15 MED ORDER — LACTATED RINGERS IV SOLN: 500.0000 mL | INTRAVENOUS | Status: DC | PRN

## 2019-12-15 MED ORDER — TERBUTALINE SULFATE 1 MG/ML IJ SOLN: 0.2500 mg | Freq: Once | INTRAMUSCULAR | Status: DC | PRN

## 2019-12-15 MED ORDER — SIMETHICONE 80 MG PO CHEW: 80.0000 mg | CHEWABLE_TABLET | ORAL | Status: DC | PRN

## 2019-12-15 MED ORDER — ONDANSETRON HCL 4 MG PO TABS: 4.0000 mg | ORAL_TABLET | ORAL | Status: DC | PRN

## 2019-12-15 MED ORDER — MISOPROSTOL 50MCG HALF TABLET
ORAL_TABLET | ORAL | Status: AC
  Filled 2019-12-15: qty 1

## 2019-12-15 MED ORDER — OXYCODONE-ACETAMINOPHEN 5-325 MG PO TABS: 2.0000 | ORAL_TABLET | ORAL | Status: DC | PRN

## 2019-12-15 MED ORDER — LIDOCAINE HCL (PF) 1 % IJ SOLN: 30.0000 mL | INTRAMUSCULAR | Status: DC | PRN

## 2019-12-15 MED ORDER — OXYTOCIN BOLUS FROM INFUSION
500.0000 mL | Freq: Once | INTRAVENOUS | Status: AC
  Administered 2019-12-15: 500 mL via INTRAVENOUS

## 2019-12-15 MED ORDER — TRANEXAMIC ACID-NACL 1000-0.7 MG/100ML-% IV SOLN
INTRAVENOUS | Status: AC
  Filled 2019-12-15: qty 100

## 2019-12-15 MED ORDER — DIPHENHYDRAMINE HCL 25 MG PO CAPS: 25.0000 mg | ORAL_CAPSULE | Freq: Four times a day (QID) | ORAL | Status: DC | PRN

## 2019-12-15 MED ORDER — TETANUS-DIPHTH-ACELL PERTUSSIS 5-2.5-18.5 LF-MCG/0.5 IM SUSP: 0.5000 mL | Freq: Once | INTRAMUSCULAR | Status: DC

## 2019-12-15 MED ORDER — OXYCODONE-ACETAMINOPHEN 5-325 MG PO TABS: 1.0000 | ORAL_TABLET | ORAL | Status: DC | PRN

## 2019-12-15 MED ORDER — SENNOSIDES-DOCUSATE SODIUM 8.6-50 MG PO TABS
2.0000 | ORAL_TABLET | ORAL | Status: DC
  Administered 2019-12-15: 23:00:00 2 via ORAL
  Filled 2019-12-15: qty 2

## 2019-12-15 MED ORDER — IBUPROFEN 600 MG PO TABS
600.0000 mg | ORAL_TABLET | Freq: Four times a day (QID) | ORAL | Status: DC
  Administered 2019-12-15 – 2019-12-16 (×4): 600 mg via ORAL
  Filled 2019-12-15 (×4): qty 1

## 2019-12-15 MED ORDER — OXYTOCIN 40 UNITS IN NORMAL SALINE INFUSION - SIMPLE MED: 2.5000 [IU]/h | INTRAVENOUS | Status: DC

## 2019-12-15 MED ORDER — MISOPROSTOL 50MCG HALF TABLET
50.0000 ug | ORAL_TABLET | ORAL | Status: DC
  Administered 2019-12-15 (×2): 50 ug via ORAL
  Filled 2019-12-15: qty 1

## 2019-12-15 MED ORDER — SODIUM CHLORIDE (PF) 0.9 % IJ SOLN
INTRAMUSCULAR | Status: DC | PRN
  Administered 2019-12-15: 12 mL/h via EPIDURAL

## 2019-12-15 MED ORDER — DIPHENHYDRAMINE HCL 50 MG/ML IJ SOLN: 12.5000 mg | INTRAMUSCULAR | Status: DC | PRN

## 2019-12-15 MED ORDER — FENTANYL-BUPIVACAINE-NACL 0.5-0.125-0.9 MG/250ML-% EP SOLN
12.0000 mL/h | EPIDURAL | Status: DC | PRN
  Filled 2019-12-15: qty 250

## 2019-12-15 MED ORDER — ONDANSETRON HCL 4 MG/2ML IJ SOLN: 4.0000 mg | INTRAMUSCULAR | Status: DC | PRN

## 2019-12-15 MED ORDER — BENZOCAINE-MENTHOL 20-0.5 % EX AERO
1.0000 "application " | INHALATION_SPRAY | CUTANEOUS | Status: DC | PRN
  Administered 2019-12-16: 1 via TOPICAL
  Filled 2019-12-15: qty 56

## 2019-12-15 MED ORDER — LIDOCAINE-EPINEPHRINE (PF) 2 %-1:200000 IJ SOLN
INTRAMUSCULAR | Status: DC | PRN
  Administered 2019-12-15 (×2): 2 mL via EPIDURAL

## 2019-12-15 MED ORDER — DIBUCAINE (PERIANAL) 1 % EX OINT: 1.0000 "application " | TOPICAL_OINTMENT | CUTANEOUS | Status: DC | PRN

## 2019-12-15 MED ORDER — COCONUT OIL OIL: 1.0000 "application " | TOPICAL_OIL | Status: DC | PRN

## 2019-12-15 MED ORDER — PHENYLEPHRINE 40 MCG/ML (10ML) SYRINGE FOR IV PUSH (FOR BLOOD PRESSURE SUPPORT)
80.0000 ug | PREFILLED_SYRINGE | INTRAVENOUS | Status: DC | PRN
  Filled 2019-12-15: qty 10

## 2019-12-15 MED ORDER — WITCH HAZEL-GLYCERIN EX PADS: 1.0000 "application " | MEDICATED_PAD | CUTANEOUS | Status: DC | PRN

## 2019-12-15 NOTE — Anesthesia Procedure Notes (Signed)
Epidural Patient location during procedure: OB Start time: 12/15/2019 3:50 PM End time: 12/15/2019 4:05 PM  Staffing Anesthesiologist: Elmer Picker, MD Performed: anesthesiologist   Preanesthetic Checklist Completed: patient identified, IV checked, risks and benefits discussed, monitors and equipment checked, pre-op evaluation and timeout performed  Epidural Patient position: sitting Prep: DuraPrep and site prepped and draped Patient monitoring: continuous pulse ox, blood pressure, heart rate and cardiac monitor Approach: midline Location: L3-L4 Injection technique: LOR air  Needle:  Needle type: Tuohy  Needle gauge: 17 G Needle length: 9 cm Needle insertion depth: 5 cm Catheter type: closed end flexible Catheter size: 19 Gauge Catheter at skin depth: 11 cm Test dose: negative  Assessment Sensory level: T8 Events: blood not aspirated, injection not painful, no injection resistance, no paresthesia and negative IV test  Additional Notes Patient identified. Risks/Benefits/Options discussed with patient including but not limited to bleeding, infection, nerve damage, paralysis, failed block, incomplete pain control, headache, blood pressure changes, nausea, vomiting, reactions to medication both or allergic, itching and postpartum back pain. Confirmed with bedside nurse the patient's most recent platelet count. Confirmed with patient that they are not currently taking any anticoagulation, have any bleeding history or any family history of bleeding disorders. Patient expressed understanding and wished to proceed. All questions were answered. Sterile technique was used throughout the entire procedure. Please see nursing notes for vital signs. Test dose was given through epidural catheter and negative prior to continuing to dose epidural or start infusion. Warning signs of high block given to the patient including shortness of breath, tingling/numbness in hands, complete motor block,  or any concerning symptoms with instructions to call for help. Patient was given instructions on fall risk and not to get out of bed. All questions and concerns addressed with instructions to call with any issues or inadequate analgesia.  Reason for block:procedure for pain

## 2019-12-15 NOTE — Progress Notes (Signed)
Eileen Morris is a 24 y.o. [id number] at [redacted]w[redacted]d by LMP admitted for induction of labor due to Gestational diabetes.  Subjective: Coping well.  Contractions are becoming much stronger, would like to request an epidural.  Feeling like leaking fluid.   Objective: BP (!) 102/56   Pulse 92   Temp 98 F (36.7 C) (Oral)   Resp 18   Ht 5\' 4"  (1.626 m)   Wt 99.1 kg   LMP 03/13/2019   BMI 37.49 kg/m  No intake/output data recorded. Total I/O In: 1093 [P.O.:360; I.V.:733] Out: -   FHT:  FHR: 150 bpm, variability: moderate,  accelerations:  Present,  decelerations:  Absent UC:   regular, every 2-4 minutes SVE:   Dilation: 3.5 Effacement (%): 50 Station: -1 Exam by:: [email or web address], [name]   -Leaking fluid, SROM @ 1530  Labs: Lab Results  Component Value Date   WBC 10.9 (H) 12/15/2019   HGB 13.0 12/15/2019   HCT 39.0 12/15/2019   MCV 92.9 12/15/2019   PLT 204 12/15/2019    Assessment / Plan: IOL for GDM A1, progressing well on pitocin  -Desires epidural.   -Continue to increase pitocin to achieve adequate contraction pattern   Labor: Progressing normally Fetal Wellbeing:  Category I Pain Control:  Epidural and IV pain meds I/D:  n/a Anticipated MOD:  NSVD  02/12/2020, [name] 12/15/2019, 3:32 PM

## 2019-12-15 NOTE — Discharge Summary (Addendum)
Postpartum Discharge Summary    Patient Name: Eileen Morris DOB: 1996-09-17 MRN: [id number]  Date of admission: 12/15/2019 Delivering Provider: Marcille Buffy D   Date of discharge: 12/16/2019  Admitting diagnosis: Gestational diabetes [O24.419] Intrauterine pregnancy: [redacted]w[redacted]d    Secondary diagnosis:  Active Problems:   Gestational diabetes  Additional problems: none     Discharge diagnosis: Term Pregnancy Delivered and GDM A1                                                                                                Post partum procedures:None  Augmentation: Pitocin  Complications: None  Hospital course:  Induction of Labor With Vaginal Delivery   24y.o. yo [id number] 316w4das admitted to the hospital 12/15/2019 for induction of labor.  Indication for induction: A1 DM.  Patient had an uncomplicated labor course as follows: Membrane Rupture Time/Date: 3:28 PM ,12/15/2019   Intrapartum Procedures: Episiotomy: None [1]                                         Lacerations:  1st degree [2];Perineal [11]  Patient had delivery of a Viable infant.  Information for the patient's newborn:  [name], Morris[[id number]]Delivery Method: Vaginal, Spontaneous(Filed from Delivery Summary)    12/15/2019  Details of delivery can be found in separate delivery note.  Patient had a routine postpartum course. Patient is discharged home 12/16/19. Delivery time: 5:53 PM    Magnesium Sulfate received: No BMZ received: No Rhophylac:N/A MMR:N/A Transfusion:No  Physical exam  Vitals:   12/15/19 2010 12/15/19 2110 12/16/19 0136 12/16/19 0500  BP: 121/63 127/75 126/79 129/73  Pulse: 75 86 81 75  Resp: '16 16 16 16  '$ Temp: 97.7 F (36.5 C) 97.8 F (36.6 C) 97.8 F (36.6 C) 97.8 F (36.6 C)  TempSrc: Oral Oral Oral Axillary  SpO2:      Weight:      Height:       General: alert, cooperative and no distress Lochia: appropriate Uterine Fundus: firm Incision: N/A DVT Evaluation:  No evidence of DVT seen on physical exam. Labs: Lab Results  Component Value Date   WBC 10.9 (H) 12/15/2019   HGB 13.0 12/15/2019   HCT 39.0 12/15/2019   MCV 92.9 12/15/2019   PLT 204 12/15/2019   No flowsheet data found.  Discharge instruction: per After Visit Summary and "Baby and Me Booklet".  After visit meds:  Allergies as of 12/16/2019   No Known Allergies     Medication List    TAKE these medications   Accu-Chek FastClix Lancets Misc 1 Units by Percutaneous route 4 (four) times daily.   Accu-Chek FastClix Lancets Misc Use as directed   Accu-Chek Guide w/Device Kit 1 kit by Subdermal route 4 (four) times daily. Check blood sugars for fasting, and two hours after breakfast, lunch and dinner (4 checks daily)   Accu-Chek SmartView test strip Generic drug: glucose blood Use as instructed to check blood sugars  glucose blood test strip Use as instructed   acetaminophen 500 MG tablet Commonly known as: TYLENOL Take 500 mg by mouth every 6 (six) hours as needed for headache.   Blood Pressure Kit Devi 1 kit by Does not apply route as needed.   Comfort Fit Maternity Supp Lg Misc 1 Units by Does not apply route daily.   ibuprofen 600 MG tablet Commonly known as: ADVIL Take 1 tablet (600 mg total) by mouth every 6 (six) hours as needed for mild pain or moderate pain.   prenatal multivitamin Tabs tablet Take 1 tablet by mouth daily at 12 noon.      Diet: routine diet  Activity: Advance as tolerated. Pelvic rest for 6 weeks.   Outpatient follow up:6 weeks Follow up Appt: Future Appointments  Date Time Provider [place]  01/12/2020  1:15 PM Chancy Milroy, MD [facility] None  01/26/2020  8:30 AM [facility] None   Follow up Visit:   Please schedule this patient for Postpartum visit in: 4 weeks with the following provider: Any provider For C/S patients schedule nurse incision check in weeks 2 weeks: no Low risk pregnancy complicated by:  GDM Delivery mode:  SVD Anticipated Birth Control:  OCPs PP Procedures needed: 2 hour GTT  Schedule Integrated BH visit: no  Newborn Data: Live born female  Birth Weight:  4110g APGAR: 93, 9  Newborn Delivery   Birth date/time: 12/15/2019 17:53:00 Delivery type: Vaginal, Spontaneous      Baby Feeding: Bottle and Breast Disposition:home with mother  Wilber Oliphant, M.D.  8:26 AM 12/16/2019  OB FELLOW DISCHARGE ATTESTATION  I have seen and examined this patient and agree with above documentation in the resident's note.   Phill Myron, D.O. OB Fellow  12/16/2019, 11:01 AM

## 2019-12-15 NOTE — Anesthesia Preprocedure Evaluation (Signed)
Anesthesia Evaluation  Patient identified by MRN, date of birth, ID band Patient awake    Reviewed: Allergy & Precautions, NPO status , Patient's Chart, lab work & pertinent test results  Airway Mallampati: II  TM Distance: >3 FB Neck ROM: Full    Dental no notable dental hx.    Pulmonary neg pulmonary ROS,    Pulmonary exam normal breath sounds clear to auscultation       Cardiovascular Normal cardiovascular exam Rhythm:Regular Rate:Normal     Neuro/Psych negative neurological ROS  negative psych ROS   GI/Hepatic negative GI ROS, Neg liver ROS,   Endo/Other  negative endocrine ROSdiabetes, Gestational  Renal/GU negative Renal ROS  negative genitourinary   Musculoskeletal negative musculoskeletal ROS (+)   Abdominal   Peds  Hematology negative hematology ROS (+)   Anesthesia Other Findings   Reproductive/Obstetrics (+) Pregnancy                             Anesthesia Physical Anesthesia Plan  ASA: III  Anesthesia Plan: Epidural   Post-op Pain Management:    Induction:   PONV Risk Score and Plan: Treatment may vary due to age or medical condition  Airway Management Planned: Natural Airway  Additional Equipment:   Intra-op Plan:   Post-operative Plan:   Informed Consent: I have reviewed the patients History and Physical, chart, labs and discussed the procedure including the risks, benefits and alternatives for the proposed anesthesia with the patient or authorized representative who has indicated his/her understanding and acceptance.       Plan Discussed with: Anesthesiologist  Anesthesia Plan Comments: (Patient identified. Risks, benefits, options discussed with patient including but not limited to bleeding, infection, nerve damage, paralysis, failed block, incomplete pain control, headache, blood pressure changes, nausea, vomiting, reactions to medication, itching, and  post partum back pain. Confirmed with bedside nurse the patient's most recent platelet count. Confirmed with the patient that they are not taking any anticoagulation, have any bleeding history or any family history of bleeding disorders. Patient expressed understanding and wishes to proceed. All questions were answered. )        Anesthesia Quick Evaluation

## 2019-12-15 NOTE — Progress Notes (Signed)
SVE: 4/50/-1, will start pitocin.  Epidural PRN  Sherlyn Lees, [name]

## 2019-12-15 NOTE — Progress Notes (Signed)
Kenlynn Del Marchelle Folks Jefcoat is a 24 y.o. [id number] at [redacted]w[redacted]d by LMP admitted for induction of labor due to Gestational diabetes A1.  Subjective: Doing well, feeling some contractions but nothing consistent.  No complaints.  Objective: BP 128/71   Pulse (!) 104   Temp 98 F (36.7 C) (Oral)   Resp 16   Ht 5\' 4"  (1.626 m)   Wt 99.1 kg   LMP 03/13/2019   BMI 37.49 kg/m  No intake/output data recorded. Total I/O In: 660.6 [P.O.:240; I.V.:420.6] Out: -   FHT:  FHR: 150 bpm, variability: moderate,  accelerations:  Present,  decelerations:  Absent UC:   irregular, every 8 minutes SVE:   Dilation: 2.5 Effacement (%): 50 Station: -1 Exam by:: [email or web address], RN   Labs: Lab Results  Component Value Date   WBC 10.9 (H) 12/15/2019   HGB 13.0 12/15/2019   HCT 39.0 12/15/2019   MCV 92.9 12/15/2019   PLT 204 12/15/2019    Assessment / Plan: IOL for GDM A1   -SVE at 1330 -Based on SVE, will determine if will initiate Pitocin, AROM, or FB -Continuous monitoring  Labor: Latent phase Fetal Wellbeing:  Category I Pain Control:  Epidural and IV pain meds, PRN I/D:  n/a Anticipated MOD:  NSVD  02/12/2020, [name] 12/15/2019, 12:40 PM

## 2019-12-15 NOTE — H&P (Addendum)
OBSTETRIC ADMISSION HISTORY AND PHYSICAL  Eileen Morris is a 24 y.o. female [phone] with IUP at 31w4dpresenting for IOL due to A1 gestational diabetes. She reports +FMs. No LOF, VB, blurry vision, headaches, peripheral edema, or RUQ pain. She plans on breast and bottle feeding. She requests OCP's for birth control.  Dating: By LMP --->  Estimated Date of Delivery: 12/18/19  Sono: 12/06/2018 @ 364w3dnormal anatomy, cephalic presentation, [id number]%ile, EFW 7lb 10oz   Prenatal History/Complications: GDM A1  Past Medical History: Past Medical History:  Diagnosis Date  . Gestational diabetes     Past Surgical History: Past Surgical History:  Procedure Laterality Date  . HAND SURGERY      Obstetrical History: OB History     Gravida  4   Para  1   Term  1   Preterm      AB  2   Living  1      SAB  2   TAB      Ectopic      Multiple  0   Live Births  1           Social History: Social History   Socioeconomic History  . Marital status: Married    Spouse name: Not on file  . Number of children: Not on file  . Years of education: Not on file  . Highest education level: Not on file  Occupational History  . Not on file  Tobacco Use  . Smoking status: Never Smoker  . Smokeless tobacco: Never Used  Substance and Sexual Activity  . Alcohol use: No  . Drug use: No  . Sexual activity: Yes    Birth control/protection: None  Other Topics Concern  . Not on file  Social History Narrative  . Not on file   Social Determinants of Health   Financial Resource Strain:   . Difficulty of Paying Living Expenses: Not on file  Food Insecurity:   . Worried About RuCharity fundraisern the Last Year: Not on file  . Ran Out of Food in the Last Year: Not on file  Transportation Needs:   . Lack of Transportation (Medical): Not on file  . Lack of Transportation (Non-Medical): Not on file  Physical Activity:   . Days of Exercise per Week: Not on file  .  Minutes of Exercise per Session: Not on file  Stress:   . Feeling of Stress : Not on file  Social Connections:   . Frequency of Communication with Friends and Family: Not on file  . Frequency of Social Gatherings with Friends and Family: Not on file  . Attends Religious Services: Not on file  . Active Member of Clubs or Organizations: Not on file  . Attends ClArchivisteetings: Not on file  . Marital Status: Not on file    Family History: Family History  Problem Relation Age of Onset  . Hypertension Mother   . Hypertension Maternal Aunt   . Cancer Paternal Grandmother     Allergies: No Known Allergies  Medications Prior to Admission  Medication Sig Dispense Refill Last Dose  . Accu-Chek FastClix Lancets MISC 1 Units by Percutaneous route 4 (four) times daily. 100 each 12   . Accu-Chek FastClix Lancets MISC Use as directed 100 each 12   . acetaminophen (TYLENOL) 500 MG tablet Take 500 mg by mouth every 6 (six) hours as needed for headache.     . Blood Glucose Monitoring  Suppl (ACCU-CHEK GUIDE) w/Device KIT 1 kit by Subdermal route 4 (four) times daily. Check blood sugars for fasting, and two hours after breakfast, lunch and dinner (4 checks daily) 1 kit 0   . Blood Pressure Monitoring (BLOOD PRESSURE KIT) DEVI 1 kit by Does not apply route as needed. 1 kit 0   . cyclobenzaprine (FLEXERIL) 10 MG tablet Take 1 tablet (10 mg total) by mouth at bedtime as needed for muscle spasms. (Patient not taking: Reported on 11/18/2019) 10 tablet 0   . docusate sodium (COLACE) 100 MG capsule Take 1 capsule (100 mg total) by mouth 2 (two) times daily. (Patient not taking: Reported on 11/02/2019) 60 capsule 11   . Elastic Bandages & Supports (COMFORT FIT MATERNITY SUPP LG) MISC 1 Units by Does not apply route daily. 1 each 0   . glucose blood (ACCU-CHEK SMARTVIEW) test strip Use as instructed to check blood sugars 100 each 12   . glucose blood test strip Use as instructed 100 each 12   .  loratadine (CLARITIN) 10 MG tablet Take 1 tablet (10 mg total) by mouth daily. (Patient not taking: Reported on 08/28/2019) 30 tablet 11   . ondansetron (ZOFRAN ODT) 8 MG disintegrating tablet Take 1 tablet (8 mg total) by mouth every 8 (eight) hours as needed for nausea or vomiting. (Patient not taking: Reported on 05/04/2018) 20 tablet 0   . Prenatal Vit-Fe Fumarate-FA (PRENATAL MULTIVITAMIN) TABS tablet Take 1 tablet by mouth daily at 12 noon.     . ranitidine (ZANTAC) 150 MG tablet Take 1 tablet (150 mg total) by mouth 2 (two) times daily. (Patient not taking: Reported on 11/18/2019) 60 tablet 5      Review of Systems:  All systems reviewed and negative except as stated in HPI  PE: Blood pressure 128/72, pulse (!) 101, temperature 98 F (36.7 C), temperature source Oral, resp. rate 18, height '5\' 4"'$  (1.626 m), weight 99.1 kg, last menstrual period 03/13/2019, currently breastfeeding. General appearance: alert, cooperative and no distress Lungs: regular rate and effort Heart: regular rate  Abdomen: soft, non-tender Extremities: Homans sign is negative, no sign of DVT Presentation: cephalic EFM: 030 bpm, moderate variability, accels, no decels Toco: Irritability Dilation: 2.5 Effacement (%): 50 Station: -1 Exam by:: Sharyn Lull, RN   Prenatal labs: ABO, Rh: --/--/A POS (01/12 0751) Antibody: NEG (01/12 0751) Rubella: 1.94 (09/25 1126) RPR: Non Reactive (10/23 0910)  HBsAg: Negative (09/25 1126)  HIV: Non Reactive (10/23 0910)  GBS: --Henderson Cloud (12/23 0923)   Prenatal Transfer Tool  Maternal Diabetes: Yes:  Diabetes Type:  Diet controlled Genetic Screening: Normal Maternal Ultrasounds/Referrals: Normal Fetal Ultrasounds or other Referrals:  Referred to Materal Fetal Medicine  Maternal Substance Abuse:  No Significant Maternal Medications:  None Significant Maternal Lab Results: Group B Strep negative  Results for orders placed or performed during the hospital encounter of  12/15/19 (from the past 24 hour(s))  Glucose, capillary   Collection Time: 12/15/19  7:46 AM  Result Value Ref Range   Glucose-Capillary 118 (H) 70 - 99 mg/dL  CBC   Collection Time: 12/15/19  7:51 AM  Result Value Ref Range   WBC 10.9 (H) 4.0 - 10.5 K/uL   RBC 4.20 3.87 - 5.11 MIL/uL   Hemoglobin 13.0 12.0 - 15.0 g/dL   HCT 39.0 36.0 - 46.0 %   MCV 92.9 80.0 - 100.0 fL   MCH 31.0 26.0 - 34.0 pg   MCHC 33.3 30.0 - 36.0 g/dL   RDW 13.9  11.5 - 15.5 %   Platelets 204 150 - 400 K/uL   nRBC 0.0 0.0 - 0.2 %  Type and screen   Collection Time: 12/15/19  7:51 AM  Result Value Ref Range   ABO/RH(D) A POS    Antibody Screen NEG    Sample Expiration      12/18/2019,2359 Performed at [facility], [place] [address], [place], [place] [address]     Patient Active Problem List   Diagnosis Date Noted  . Gestational diabetes 12/15/2019  . Diet controlled gestational diabetes mellitus (GDM) in third trimester 09/26/2019  . Supervision of normal pregnancy 08/27/2019    Assessment: Eileen Morris is a 24 y.o. [id number] at 60w4dhere for IOL due to gestational diabetes   1. Labor: Latent phase 2. FWB: Reassuring, category 1 3. Pain: Epidural PRN 4. GBS: Negative   Plan: Admit to labor and delivery  -Cytotec 50 mcg oral now -SVE in 4 hours to assess need to start pitocin or place foley bulb -Continuous fetal monitoring -Fentanyl 100 mcg PRN pain -Epidural 100 mcg PRN pain  ADarlin Coco Student-MidWife  12/15/2019, 9:29 AM   I confirm that I have verified the information documented in the nurse midwife student's note and that I have also personally reperformed the history, physical exam and all medical decision making activities of this service and have verified that all service and findings are accurately documented in this student's note.   HMarcille BuffyDNP, CNM  12/15/19  11:31 AM

## 2019-12-16 LAB — GLUCOSE, CAPILLARY: Glucose-Capillary: 100 mg/dL — ABNORMAL HIGH (ref 70–99)

## 2019-12-16 MED ORDER — IBUPROFEN 600 MG PO TABS: 600.0000 mg | ORAL_TABLET | Freq: Four times a day (QID) | ORAL | 0 refills | Status: DC | PRN

## 2019-12-16 NOTE — Lactation Note (Signed)
This note was copied from a baby's chart. Lactation Consultation Note  Patient Name: Eileen Morris [id number]'O Date: 12/16/2019 Reason for consult: Initial assessment;Maternal endocrine disorder Type of Endocrine Disorder?: Diabetes  P2, Baby 16 hours old.  Ex BF 11 mos. Mother wants to breastfeed and formula.  She states she has "no milk". She states she is unable to hand express drops. Reviewed hand expression.  Good drops expressed and mother felt happy. Feed on demand with cues.  Goal 8-12+ times per day after first 24 hrs.  Place baby STS if not cueing.  Mom made aware of O/P services, breastfeeding support groups, community resources, and our phone # for post-discharge questions.      Maternal Data Has patient been taught Hand Expression?: Yes Does the patient have breastfeeding experience prior to this delivery?: Yes  Feeding Nipple Type: Slow - flow  LATCH Score                   Interventions Interventions: Breast feeding basics reviewed;Hand express  Lactation Tools Discussed/Used     Consult Status Consult Status: Follow-up Date: 12/17/19 Follow-up type: In-patient    Dahlia Byes [facility] 12/16/2019, 10:55 AM

## 2019-12-16 NOTE — Anesthesia Postprocedure Evaluation (Signed)
Anesthesia Post Note  Patient: Eileen Morris [name]  Procedure(s) Performed: AN AD HOC LABOR EPIDURAL     Patient location during evaluation: Mother Baby Anesthesia Type: Epidural Level of consciousness: awake, awake and alert and oriented Pain management: pain level controlled Vital Signs Assessment: post-procedure vital signs reviewed and stable Respiratory status: spontaneous breathing and respiratory function stable Cardiovascular status: blood pressure returned to baseline Postop Assessment: no headache, epidural receding, patient able to bend at knees, adequate PO intake, no backache, no apparent nausea or vomiting and able to ambulate Anesthetic complications: no    Last Vitals:  Vitals:   12/16/19 0136 12/16/19 0500  BP: 126/79 129/73  Pulse: 81 75  Resp: 16 16  Temp: 36.6 C 36.6 C  SpO2:      Last Pain:  Vitals:   12/16/19 0717  TempSrc:   PainSc: 7    Pain Goal: Patients Stated Pain Goal: 2 (12/16/19 0717)                 Cleda Clarks

## 2019-12-16 NOTE — Discharge Instructions (Signed)

## 2019-12-16 NOTE — Lactation Note (Signed)
This note was copied from a baby's chart. Lactation Consultation Note Baby 9 hrs old. LC attempted to see mom but she and everyone in room was sleeping.  Patient Name: Eileen Morris EYEMV'V Date: 12/16/2019     Maternal Data    Feeding Feeding Type: Bottle Fed - Formula  LATCH Score                   Interventions    Lactation Tools Discussed/Used     Consult Status      Markeia Harkless G 12/16/2019, 3:06 AM

## 2020-01-12 ENCOUNTER — Other Ambulatory Visit: Payer: Self-pay

## 2020-01-12 ENCOUNTER — Encounter: Payer: Self-pay | Admitting: Obstetrics and Gynecology

## 2020-01-12 ENCOUNTER — Ambulatory Visit (INDEPENDENT_AMBULATORY_CARE_PROVIDER_SITE_OTHER): Payer: Medicaid Other | Admitting: Obstetrics and Gynecology

## 2020-01-12 MED ORDER — NORETHINDRONE 0.35 MG PO TABS: 1.0000 | ORAL_TABLET | Freq: Every day | ORAL | 4 refills | Status: DC

## 2020-01-12 NOTE — Progress Notes (Signed)
Unreaginal delivery. I have fully reviewed the prenatal and intrapartum course. The delivery was at 39 gestational weeks.  Anesthesia: epidural. Postpartum course has been unremarkable. Baby's course has been unremarkable. Baby is feeding by BOTH /  Similac . Bleeding staining only. Bowel function is normal. Bladder function is normal. Patient is not sexually active. Contraception method is none. Postpartum depression screening:neg   Last pap smear done 11/13/2017 and was Normal  Review of Systems Pertinent items noted in HPI and remainder of comprehensive ROS otherwise negative.    Objective:  Last menstrual period 03/13/2019, unknown if currently breastfeeding.  General:  alert, cooperative and no distress   Breasts:  inspection negative, no nipple discharge or bleeding, no masses or nodularity palpable  Lungs: clear to auscultation bilaterally  Heart:  regular rate and rhythm  Abdomen: soft, non-tender; bowel sounds normal; no masses,  no organomegaly   Vulva:  normal  Vagina: normal vagina, no discharge, exudate, lesion, or erythema  Cervix:  multiparous appearance  Corpus: normal size, contour, position, consistency, mobility, non-tender  Adnexa:  no mass, fullness, tenderness  Rectal Exam: Not performed.        Assessment:    Normal postpartum exam. Pap smear not done at today's visit.   Plan:   1. Contraception: oral progesterone-only contraceptive. Patient is considering IUD 2. Patient is medically cleared to resume all activities of daily living 3. Follow up in: 6 months for annual exam and possible IUD insertion or as needed.

## 2020-01-26 ENCOUNTER — Other Ambulatory Visit: Payer: Medicaid Other

## 2020-05-09 ENCOUNTER — Ambulatory Visit: Payer: Medicaid Other | Admitting: Obstetrics

## 2020-07-22 IMAGING — US US MFM OB DETAIL+14 WK
1 series · 13 of 28 positions shown · non-contrast
Comparison: none

[Series 1: us mfm ob detail+14 wk · 74 acquisitions, 13 frames shown]
[im 3/74]
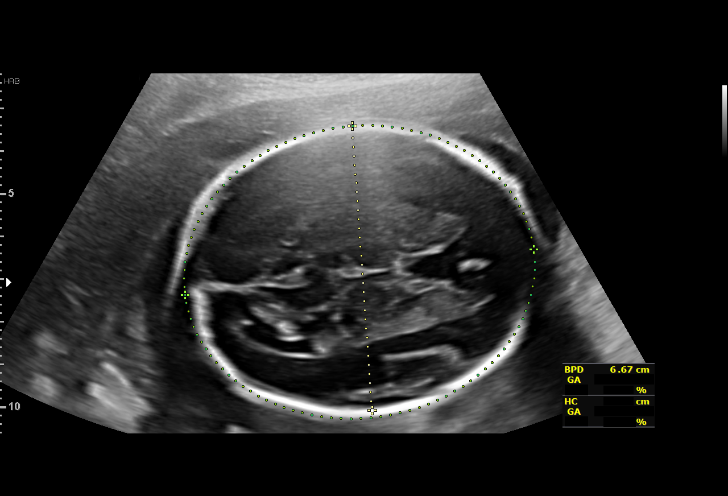
[im 9/74]
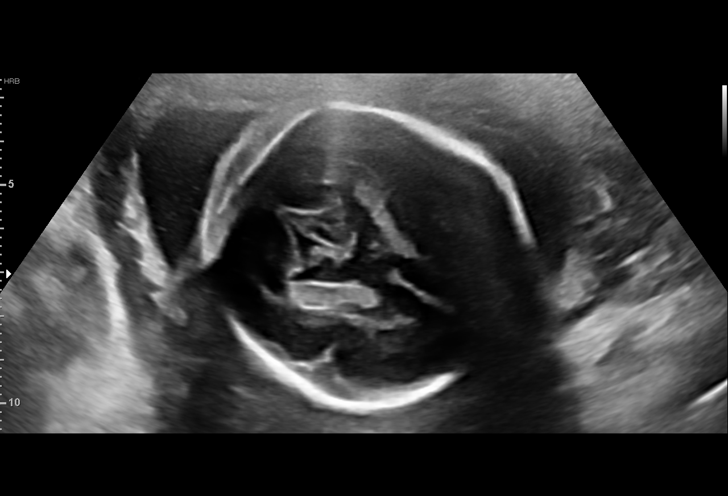
[im 14/74]
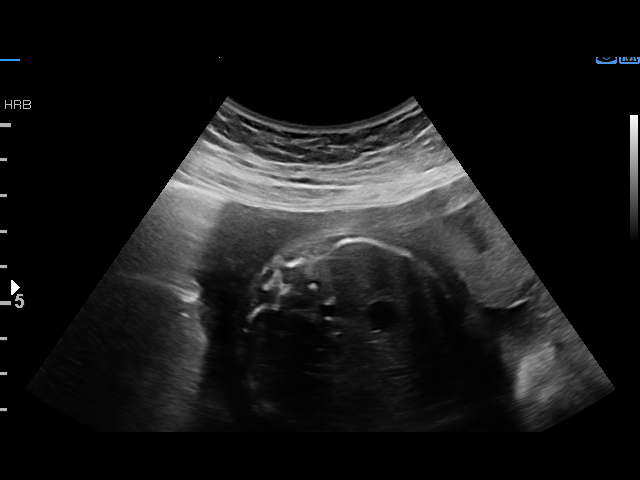
[im 19/74]
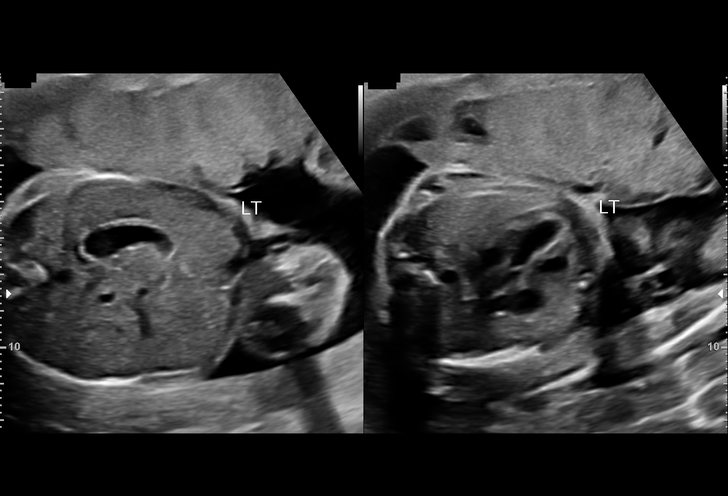
[im 25/74]
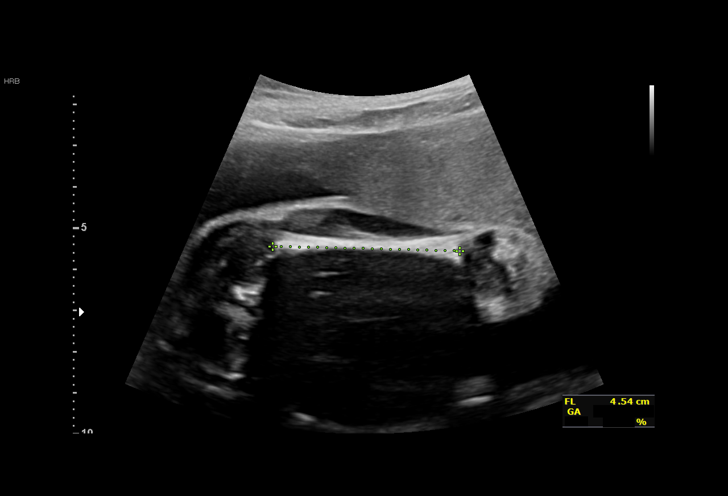
[im 30/74]
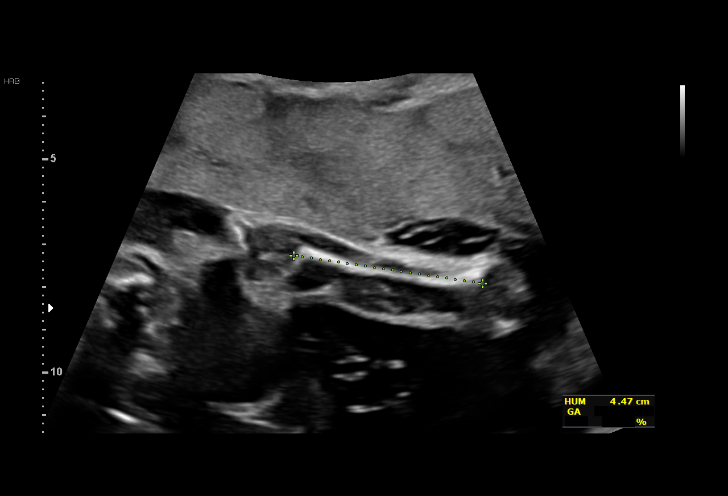
[im 38/74]
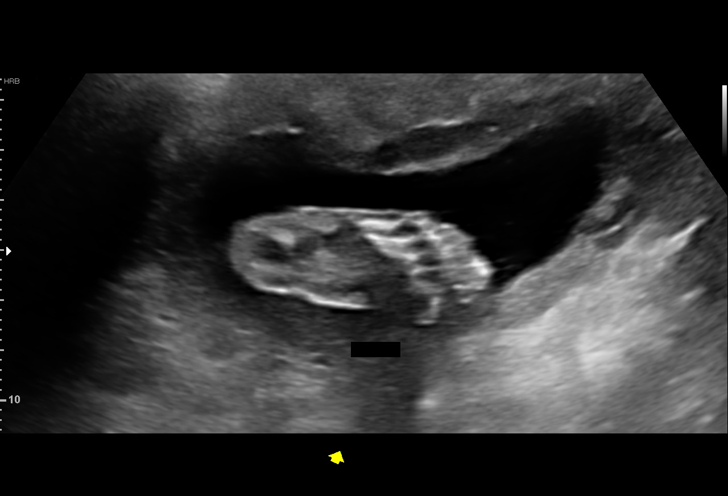
[im 44/74]
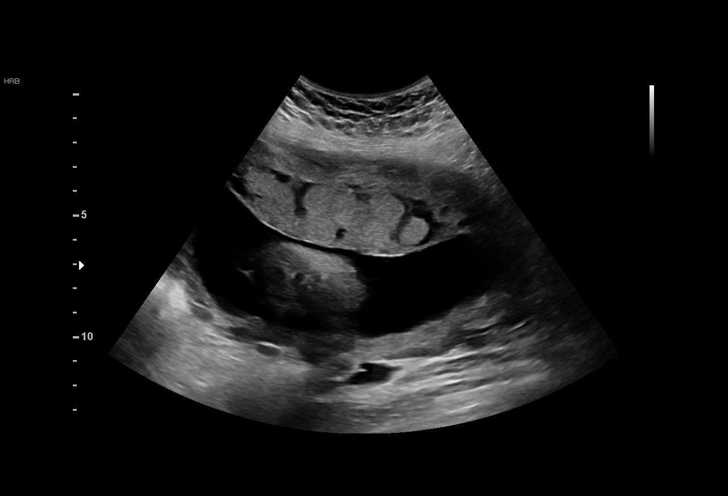
[im 49/74]
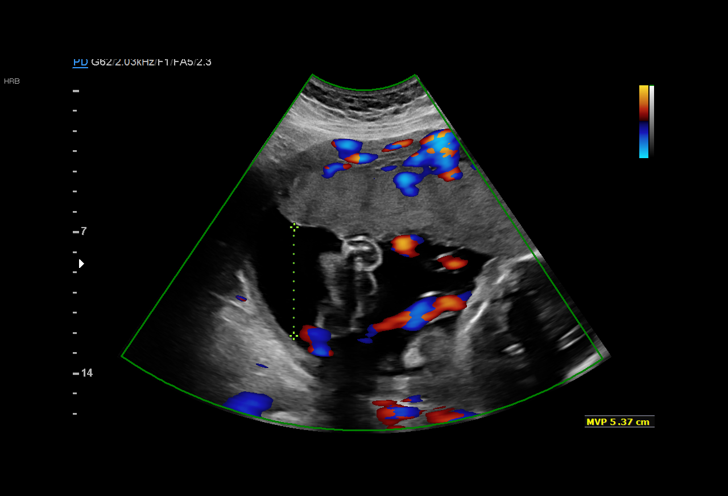
[im 55/74]
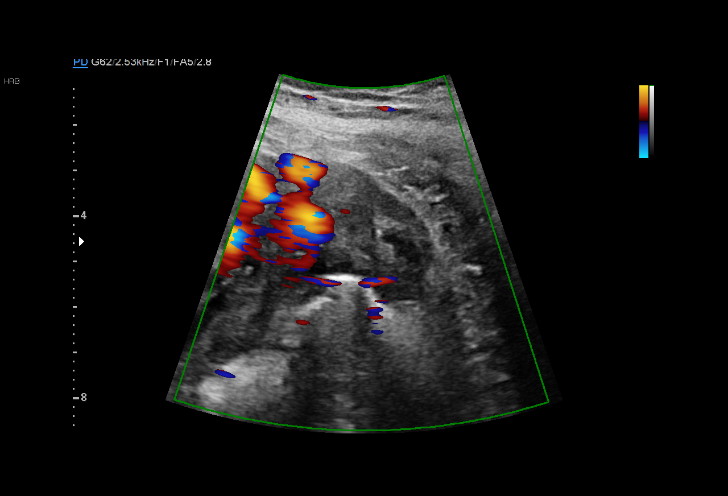
[im 60/74]
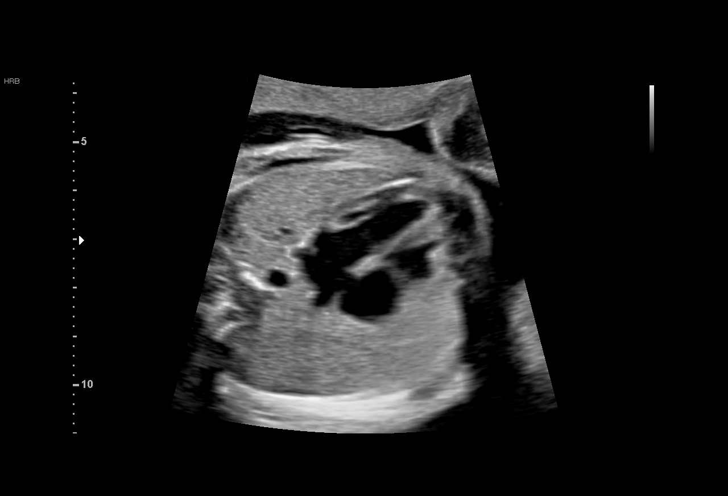
[im 65/74]
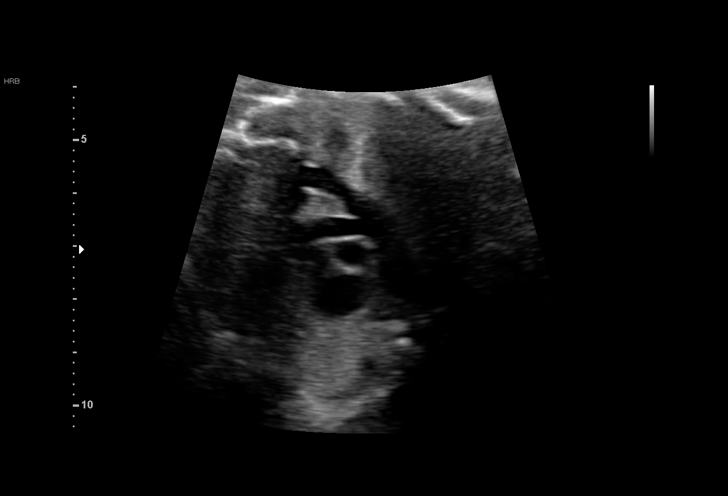
[im 71/74]
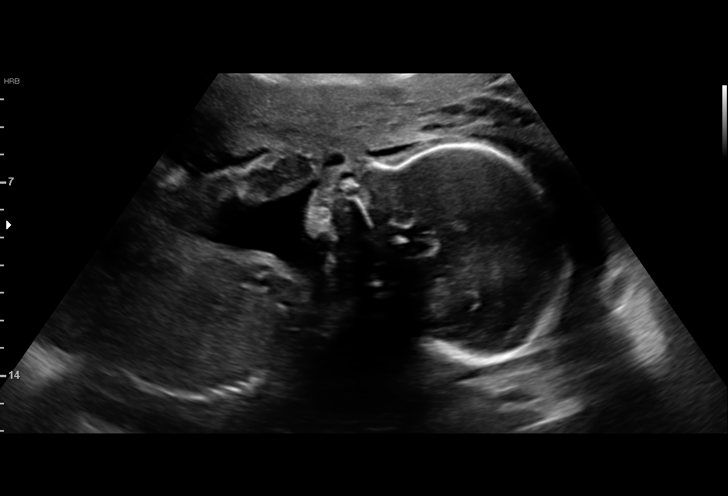

[13 of 28 positions shown; findings below may reference images not displayed]

----------------------------------------------------------------------

 ----------------------------------------------------------------------
Indications

  Encounter for antenatal screening for
  malformations
  26 weeks gestation of pregnancy
  Obesity complicating pregnancy, second
  trimester
 ----------------------------------------------------------------------
Vital Signs

 BMI:
Fetal Evaluation

 Num Of Fetuses:          1
 Cardiac Activity:        Observed
 Presentation:            Cephalic
 Placenta:                Anterior
 P. Cord Insertion:       Visualized, central

 Amniotic Fluid
 AFI FV:      Within normal limits

                             Largest Pocket(cm)

Biometry

 BPD:      66.4  mm     G. Age:  26w 5d         67  %    CI:        77.54   %    70 - 86
                                                         FL/HC:       18.9  %    18.6 -
 HC:      238.7  mm     G. Age:  26w 0d         23  %    HC/AC:       1.08       1.04 -
 AC:      220.3  mm     G. Age:  26w 3d         56  %    FL/BPD:      67.8  %    71 - 87
 FL:         45  mm     G. Age:  24w 6d          9  %    FL/AC:       20.4  %    20 - 24
 HUM:      44.7  mm     G. Age:  26w 4d         58  %
 CER:      29.6  mm     G. Age:  26w 2d         57  %
 LV:        3.1  mm
 CM:        4.5  mm

 Est. FW:     863   gm   1 lb 14 oz      33  %
OB History

 Gravidity:    4         Term:   1         SAB:   2
 Living:       1
Gestational Age

 LMP:           26w 0d        Date:  03/13/19                 EDD:   12/18/19
 U/S Today:     26w 0d                                        EDD:   12/18/19
 Best:          26w 0d     Det. By:  LMP  (03/13/19)          EDD:   12/18/19
Anatomy

 Cranium:               Appears normal         Aortic Arch:            Appears normal
 Cavum:                 Appears normal         Ductal Arch:            Appears normal
 Ventricles:            Appears normal         Diaphragm:              Appears normal
 Choroid Plexus:        Appears normal         Stomach:                Appears normal, left
                                                                       sided
 Cerebellum:            Appears normal         Abdomen:                Appears normal
 Posterior Fossa:       Appears normal         Abdominal Wall:         Appears nml (cord
                                                                       insert, abd wall)
 Nuchal Fold:           Not applicable (>20    Cord Vessels:           Appears normal (3
                        wks GA)                                        vessel cord)
 Face:                  Appears normal         Kidneys:                Appear normal
                        (orbits and profile)
 Lips:                  Appears normal         Bladder:                Appears normal
 Thoracic:              Appears normal         Spine:                  Ltd views no
                                                                       intracranial signs of
                                                                       NTD
 Heart:                 Appears normal         Upper Extremities:      Appears normal
                        (4CH, axis, and
                        situs)
 RVOT:                  Appears normal         Lower Extremities:      Appears normal
 LVOT:                  Appears normal

 Other:  Male gender. Heels and Left 5th digit visualized. Technically difficult
         due to advanced GA and fetal position.
Cervix Uterus Adnexa

 Cervix
 Not visualized (advanced GA >25wks)

 Uterus
 No abnormality visualized.

 Left Ovary
 Within normal limits.

 Right Ovary
 Within normal limits.

 Cul De Sac
 No free fluid seen.
 Adnexa
 No abnormality visualized.
Impression

 Normal interval growth.  No ultrasonic evidence of structural
 fetal anomalies.
 Anatomy complete
Recommendations

 Follow up as clinically indicated.

## 2020-09-01 ENCOUNTER — Other Ambulatory Visit: Payer: Self-pay

## 2020-09-01 ENCOUNTER — Other Ambulatory Visit (HOSPITAL_COMMUNITY)
Admission: RE | Admit: 2020-09-01 | Discharge: 2020-09-01 | Disposition: A | Discharge status: Home / Self Care | Payer: Medicaid Other | Source: Ambulatory Visit | Attending: Obstetrics | Admitting: Obstetrics

## 2020-09-01 ENCOUNTER — Ambulatory Visit (INDEPENDENT_AMBULATORY_CARE_PROVIDER_SITE_OTHER): Payer: Medicaid Other | Admitting: Obstetrics

## 2020-09-01 ENCOUNTER — Encounter: Payer: Self-pay | Admitting: Obstetrics

## 2020-09-01 VITALS — BP 107/72 | HR 80 | Ht 64.0 in | Wt 183.0 lb

## 2020-09-01 DIAGNOSIS — Z131 Encounter for screening for diabetes mellitus: Secondary | ICD-10-CM

## 2020-09-01 DIAGNOSIS — M549 Dorsalgia, unspecified: Secondary | ICD-10-CM

## 2020-09-01 DIAGNOSIS — Z01419 Encounter for gynecological examination (general) (routine) without abnormal findings: Secondary | ICD-10-CM | POA: Diagnosis not present

## 2020-09-01 DIAGNOSIS — Z8632 Personal history of gestational diabetes: Secondary | ICD-10-CM | POA: Diagnosis not present

## 2020-09-01 DIAGNOSIS — N898 Other specified noninflammatory disorders of vagina: Secondary | ICD-10-CM

## 2020-09-01 DIAGNOSIS — Z3009 Encounter for other general counseling and advice on contraception: Secondary | ICD-10-CM

## 2020-09-01 DIAGNOSIS — R5383 Other fatigue: Secondary | ICD-10-CM

## 2020-09-01 DIAGNOSIS — Z3041 Encounter for surveillance of contraceptive pills: Secondary | ICD-10-CM

## 2020-09-01 LAB — POCT URINALYSIS DIPSTICK
Bilirubin, UA: NEGATIVE
Glucose, UA: NEGATIVE
Ketones, UA: NEGATIVE
Leukocytes, UA: NEGATIVE
Nitrite, UA: NEGATIVE
Protein, UA: NEGATIVE
Spec Grav, UA: <=1.005 — AB (ref 1.010–1.025)
Urobilinogen, UA: 0.2 E.U./dL
pH, UA: 6.5 (ref 5.0–8.0)

## 2020-09-01 MED ORDER — NORETHIN ACE-ETH ESTRAD-FE 1-20 MG-MCG PO TABS: 1.0000 | ORAL_TABLET | Freq: Every day | ORAL | 11 refills | Status: DC

## 2020-09-01 NOTE — Progress Notes (Signed)
Subjective:        Eileen Morris is a 24 y.o. female here for a routine exam.  Current complaints: Tired all the time.  Backache.    Personal health questionnaire:  Is patient Ashkenazi Jewish, have a family history of breast and/or ovarian cancer: no Is there a family history of uterine cancer diagnosed at age < 86, gastrointestinal cancer, urinary tract cancer, family member who is a Field seismologist syndrome-associated carrier: no Is the patient overweight and hypertensive, family history of diabetes, personal history of gestational diabetes, preeclampsia or PCOS: no Is patient over 66, have PCOS,  family history of premature CHD under age 64, diabetes, smoke, have hypertension or peripheral artery disease:  no At any time, has a partner hit, kicked or otherwise hurt or frightened you?: no Over the past 2 weeks, have you felt down, depressed or hopeless?: no Over the past 2 weeks, have you felt little interest or pleasure in doing things?:no   Gynecologic History No LMP recorded. Contraception: oral progesterone-only contraceptive Last Pap: 11-13-2017. Results were: normal Last mammogram: n/a. Results were: n/a  Obstetric History OB History  Gravida Para Term Preterm AB Living  _0 SAB TAB Ectopic Multiple Live Births  2     0 2    # Outcome Date GA Lbr Len/2nd Weight Sex Delivery Anes PTL Lv  4 Term 12/15/19 57w4d01:57 / 00:28 9 lb 1 oz (4.11 kg) M Vag-Spont EPI  LIV  3 Term 05/04/18 445w2d3:37 / 00:56 9 lb 2.9 oz (4.165 kg) M Vag-Spont EPI  LIV  2 SAB 2018          1 SAB 2017            Past Medical History:  Diagnosis Date  . Gestational diabetes    diet controlled  . History of hand surgery     Past Surgical History:  Procedure Laterality Date  . HAND SURGERY       Current Outpatient Medications:  .  Accu-Chek FastClix Lancets MISC, 1 Units by Percutaneous route 4 (four) times daily. (Patient not taking: Reported on 01/12/2020), Disp: 100 each, Rfl:  12 .  Accu-Chek FastClix Lancets MISC, Use as directed (Patient not taking: Reported on 01/12/2020), Disp: 100 each, Rfl: 12 .  acetaminophen (TYLENOL) 500 MG tablet, Take 500 mg by mouth every 6 (six) hours as needed for headache., Disp: , Rfl:  .  Blood Glucose Monitoring Suppl (ACCU-CHEK GUIDE) w/Device KIT, 1 kit by Subdermal route 4 (four) times daily. Check blood sugars for fasting, and two hours after breakfast, lunch and dinner (4 checks daily) (Patient not taking: Reported on 01/12/2020), Disp: 1 kit, Rfl: 0 .  Blood Pressure Monitoring (BLOOD PRESSURE KIT) DEVI, 1 kit by Does not apply route as needed. (Patient not taking: Reported on 01/12/2020), Disp: 1 kit, Rfl: 0 .  Elastic Bandages & Supports (COMFORT FIT MATERNITY SUPP LG) MISC, 1 Units by Does not apply route daily. (Patient not taking: Reported on 01/12/2020), Disp: 1 each, Rfl: 0 .  glucose blood (ACCU-CHEK SMARTVIEW) test strip, Use as instructed to check blood sugars (Patient not taking: Reported on 01/12/2020), Disp: 100 each, Rfl: 12 .  glucose blood test strip, Use as instructed (Patient not taking: Reported on 01/12/2020), Disp: 100 each, Rfl: 12 .  ibuprofen (ADVIL) 600 MG tablet, Take 1 tablet (600 mg total) by mouth every 6 (six) hours as needed for mild pain or moderate  pain., Disp: 30 tablet, Rfl: 0 .  norethindrone-ethinyl estradiol (LOESTRIN FE 1/20) 1-20 MG-MCG tablet, Take 1 tablet by mouth daily., Disp: 28 tablet, Rfl: 11 .  Prenatal Vit-Fe Fumarate-FA (PRENATAL MULTIVITAMIN) TABS tablet, Take 1 tablet by mouth daily at 12 noon., Disp: , Rfl:  No Known Allergies  Social History   Tobacco Use  . Smoking status: Never Smoker  . Smokeless tobacco: Never Used  Substance Use Topics  . Alcohol use: No    Family History  Problem Relation Age of Onset  . Hypertension Mother   . Hypertension Maternal Aunt   . Cancer Paternal Grandmother       Review of Systems  Constitutional: negative for fatigue  Respiratory: negative  for cough and wheezing Cardiovascular: negative for chest pain, fatigue and palpitations Gastrointestinal: negative for abdominal pain and change in bowel habits Musculoskeletal:positive for myalgias Neurological: negative for gait problems and tremors Behavioral/Psych: negative for abusive relationship, depression Endocrine: negative for temperature intolerance    Genitourinary:negative for abnormal menstrual periods, genital lesions, hot flashes, sexual problems and vaginal discharge Integument/breast: negative for breast lump, breast tenderness, nipple discharge and skin lesion(s)    Objective:       BP 107/72   Pulse 80   Ht _0  (1.626 m)   Wt 183 lb (83 kg)   Breastfeeding Yes Comment: spotting with BC and breastfeeding  BMI 31.41 kg/m  General:   alert and no distress  Skin:   no rash or abnormalities  Lungs:   clear to auscultation bilaterally  Heart:   regular rate and rhythm, S1, S2 normal, no murmur, click, rub or gallop  Breasts:   normal without suspicious masses, skin or nipple changes or axillary nodes  Abdomen:  normal findings: no organomegaly, soft, non-tender and no hernia  Pelvis:  External genitalia: normal general appearance Urinary system: urethral meatus normal and bladder without fullness, nontender Vaginal: normal without tenderness, induration or masses Cervix: normal appearance Adnexa: normal bimanual exam Uterus: anteverted and non-tender, normal size   Lab Review Urine pregnancy test Labs reviewed yes Radiologic studies reviewed no  50% of 20 min visit spent on counseling and coordination of care.   Assessment:     1. Encounter for routine gynecological examination with Papanicolaou smear of cervix Rx: - Cytology - PAP( [place])  2. Vaginal discharge Rx: - Cervicovaginal ancillary only( [place])  3. Backache symptom - Ibuprofen prn  4. Screening for diabetes mellitus Rx: - Hemoglobin A1c  5. Encounter for other general  counseling or advice on contraception - wants OCP's  42. Encounter for surveillance of contraceptive pills Rx: - norethindrone-ethinyl estradiol (LOESTRIN FE 1/20) 1-20 MG-MCG tablet; Take 1 tablet by mouth daily.  Dispense: 28 tablet; Refill: 11  7. Lethargy Rx: - CBC - TSH  8. History of gestational diabetes mellitus (GDM) Rx: - Hemoglobin A1c    Plan:    Education reviewed: calcium supplements, depression evaluation, low fat, low cholesterol diet, safe sex/STD prevention, self breast exams and weight bearing exercise. Contraception: OCP (estrogen/progesterone). Follow up in: 3 months.   Meds ordered this encounter  Medications  . norethindrone-ethinyl estradiol (LOESTRIN FE 1/20) 1-20 MG-MCG tablet    Sig: Take 1 tablet by mouth daily.    Dispense:  28 tablet    Refill:  11   Orders Placed This Encounter  Procedures  . Hemoglobin A1c  . CBC  . TSH    Shelly Bombard, MD 09/01/2020 4:48 PM

## 2020-09-02 LAB — CBC
Hematocrit: 39.6 % (ref 34.0–46.6)
Hemoglobin: 13.3 g/dL (ref 11.1–15.9)
MCH: 30.2 pg (ref 26.6–33.0)
MCHC: 33.6 g/dL (ref 31.5–35.7)
MCV: 90 fL (ref 79–97)
Platelets: 292 10*3/uL (ref 150–450)
RBC: 4.41 x10E6/uL (ref 3.77–5.28)
RDW: 12.7 % (ref 11.7–15.4)
WBC: 7.8 10*3/uL (ref 3.4–10.8)

## 2020-09-02 LAB — CERVICOVAGINAL ANCILLARY ONLY
Bacterial Vaginitis (gardnerella): NEGATIVE
Candida Glabrata: NEGATIVE
Candida Vaginitis: POSITIVE — AB
Comment: NEGATIVE
Comment: NEGATIVE
Comment: NEGATIVE

## 2020-09-02 LAB — TSH: TSH: 1.23 u[IU]/mL (ref 0.450–4.500)

## 2020-09-02 LAB — CYTOLOGY - PAP: Diagnosis: NEGATIVE

## 2020-09-02 LAB — HEMOGLOBIN A1C
Est. average glucose Bld gHb Est-mCnc: 114 mg/dL
Hgb A1c MFr Bld: 5.6 % (ref 4.8–5.6)

## 2020-09-02 NOTE — Addendum Note (Signed)
Addended by: Marya Landry D on: 09/02/2020 08:23 AM   Modules accepted: Orders

## 2020-09-03 ENCOUNTER — Other Ambulatory Visit: Payer: Self-pay | Admitting: Obstetrics

## 2020-09-03 DIAGNOSIS — B3731 Acute candidiasis of vulva and vagina: Secondary | ICD-10-CM

## 2020-09-03 MED ORDER — FLUCONAZOLE 150 MG PO TABS: 150.0000 mg | ORAL_TABLET | Freq: Once | ORAL | 0 refills | Status: DC

## 2020-09-04 LAB — URINE CULTURE

## 2020-09-06 ENCOUNTER — Telehealth: Payer: Self-pay

## 2020-09-06 NOTE — Telephone Encounter (Signed)
Patient has been informed of test results and the need to pick up rx.

## 2020-09-12 ENCOUNTER — Other Ambulatory Visit: Payer: Self-pay

## 2020-09-12 DIAGNOSIS — B373 Candidiasis of vulva and vagina: Secondary | ICD-10-CM

## 2020-09-12 DIAGNOSIS — B3731 Acute candidiasis of vulva and vagina: Secondary | ICD-10-CM

## 2020-09-12 MED ORDER — FLUCONAZOLE 150 MG PO TABS: 150.0000 mg | ORAL_TABLET | Freq: Once | ORAL | 0 refills | Status: AC

## 2020-09-12 NOTE — Progress Notes (Signed)
Pt requests rx for yeast infection be sent to different pharmacy, diflucan sent, pt notified and voices understanding.

## 2021-08-18 ENCOUNTER — Other Ambulatory Visit: Payer: Self-pay | Admitting: Obstetrics

## 2021-08-18 DIAGNOSIS — Z3041 Encounter for surveillance of contraceptive pills: Secondary | ICD-10-CM

## 2021-12-03 NOTE — L&D Delivery Note (Addendum)
Delivery Note Eileen Morris is a 26 y.o. [id number] who presented with spontaneous onset of labor.   At 12:14 PM a healthy female was delivered via Vaginal, Spontaneous (Presentation:   Occiput Anterior).  APGAR: 8, 9; weight pending. Placenta status: Spontaneous, Intact.  Cord: 3 vessels with the following complications: None.   Anesthesia: Epidural Episiotomy: None Lacerations: 1st degree;Perineal Suture Repair: 3.0 vicryl Est. Blood Loss (mL): 307 mL  Mom to postpartum.  Baby to Couplet care / Skin to Skin.  [name], MD 10/20/2022, 12:48 PM   Fellow ATTESTATION  I was present and gloved for this delivery and agree with the above documentation in the resident's note  Uncomplicated vaginal delivery. Occurred precipitously and RN was delivering the baby when I got to the room. I delivered the placenta and coordinated the rest of the delivery process.   Alfredia Ferguson, MD/[facility] (Faculty Practice) 10/20/2022, 1:03 PM

## 2022-02-21 ENCOUNTER — Other Ambulatory Visit: Payer: Self-pay

## 2022-02-21 ENCOUNTER — Ambulatory Visit (INDEPENDENT_AMBULATORY_CARE_PROVIDER_SITE_OTHER): Payer: Medicaid Other | Admitting: Emergency Medicine

## 2022-02-21 VITALS — BP 107/72 | HR 96 | Wt 172.4 lb

## 2022-02-21 DIAGNOSIS — Z3201 Encounter for pregnancy test, result positive: Secondary | ICD-10-CM | POA: Diagnosis not present

## 2022-02-21 LAB — POCT URINE PREGNANCY: Preg Test, Ur: POSITIVE — AB

## 2022-02-21 NOTE — Progress Notes (Signed)
Ms. Strausser presents today for UPT. She has no unusual complaints. ?LMP: 01/15/2022 ?   ?OBJECTIVE: Appears well, in no apparent distress.  ?OB History   ? ? Gravida  ?5  ? Para  ?2  ? Term  ?2  ? Preterm  ?   ? AB  ?2  ? Living  ?2  ?  ? ? SAB  ?2  ? IAB  ?   ? Ectopic  ?   ? Multiple  ?0  ? Live Births  ?2  ?   ?  ?  ? ?Home UPT Result: Positive ?In-Office UPT result:Positive  ? ?I have reviewed the patient's medical, obstetrical, social, and family histories, and medications.  ? ?ASSESSMENT: Positive pregnancy test ? ?PLAN ?Prenatal care to be completed at: [facility] ?  ?

## 2022-03-21 ENCOUNTER — Ambulatory Visit (INDEPENDENT_AMBULATORY_CARE_PROVIDER_SITE_OTHER): Payer: Medicaid Other

## 2022-03-21 VITALS — BP 112/73 | HR 80 | Ht 64.0 in | Wt 172.4 lb

## 2022-03-21 DIAGNOSIS — Z3481 Encounter for supervision of other normal pregnancy, first trimester: Secondary | ICD-10-CM | POA: Diagnosis not present

## 2022-03-21 DIAGNOSIS — Z3491 Encounter for supervision of normal pregnancy, unspecified, first trimester: Secondary | ICD-10-CM | POA: Insufficient documentation

## 2022-03-21 DIAGNOSIS — O3680X0 Pregnancy with inconclusive fetal viability, not applicable or unspecified: Secondary | ICD-10-CM

## 2022-03-21 MED ORDER — BLOOD PRESSURE KIT DEVI: 1.0000 | 0 refills | Status: AC

## 2022-03-21 NOTE — Progress Notes (Signed)
Agree with nurses's documentation of this patient's clinic encounter.  Belinda Schlichting L, MD  

## 2022-03-21 NOTE — Progress Notes (Cosign Needed)
New OB Intake ? ?I connected with  Eileen Morris on 03/21/22 at  1:10 PM EDT by In Person and verified that I am speaking with the correct person using two identifiers. Nurse is located at [facility] and pt is located at [place]. ? ?I discussed the limitations, risks, security and privacy concerns of performing an evaluation and management service by telephone and the availability of in person appointments. I also discussed with the patient that there may be a patient responsible charge related to this service. The patient expressed understanding and agreed to proceed. ? ?I explained I am completing New OB Intake today. We discussed her EDD of 10/22/22 that is based on LMP of 01/15/22. Pt is G5/P2022. I reviewed her allergies, medications, Medical/Surgical/OB history, and appropriate screenings. I informed her of [facility] services. Based on history, this is a/an  pregnancy uncomplicated .  ? ?Patient Active Problem List  ? Diagnosis Date Noted  ? Gestational diabetes 12/15/2019  ? Diet controlled gestational diabetes mellitus (GDM) in third trimester 09/26/2019  ? Supervision of normal pregnancy 08/27/2019  ? ? ?Concerns addressed today ? ?Delivery Plans:  ?Plans to deliver at [facility].  ? ?MyChart/Babyscripts ?MyChart access verified. I explained pt will have some visits in office and some virtually. Babyscripts instructions given and order placed. Patient verifies receipt of registration text/e-mail. Account successfully created and app downloaded. ? ?Blood Pressure Cuff  ?Blood pressure cuff ordered for patient to pick-up from [employer]. Explained after first prenatal appt pt will check weekly and document in Babyscripts. ? ?Weight scale: Patient does have weight scale. Weight scale ordered for patient to pick up from [employer].  ? ?Anatomy US ?Explained first scheduled [place] will be around 19 weeks. Dating and viability scan performed today. ? ?Labs ?Discussed Avelina Laine genetic screening with patient.  Would like both Panorama and Horizon drawn at new OB visit.Also if interested in genetic testing, tell patient she will need AFP 15-21 weeks to complete genetic testing .Routine prenatal labs needed. ? ?Covid Vaccine ?Patient has not covid vaccine.  ?  ?Is patient interested in [place]? No  "Interested in [employer] - Schedule next visit with CNM" on sticky note ? ?Informed patient of [facility] website  and placed link in her AVS.  ? ?Social Determinants of Health ?Food Insecurity: Patient denies food insecurity. ?WIC Referral: Patient is not interested in referral to [facility].  ?Transportation: Patient denies transportation needs. ?Childcare: Discussed no children allowed at ultrasound appointments. Offered childcare services; patient declines childcare services at this time. ? ?Send link to Pregnancy Navigators ? ? ?Placed OB Box on problem list and updated ? ?First visit review ?I reviewed new OB appt with pt. I explained she will have a pelvic exam, ob bloodwork with genetic screening, and PAP smear. Explained pt will be seen by Coral Ceo at first visit; encounter routed to appropriate provider. Explained that patient will be seen by pregnancy navigator following visit with provider. [facility] information placed in AVS.  ? ?Hamilton Capri, RN ?03/21/2022  1:16 PM   ?

## 2022-04-04 ENCOUNTER — Ambulatory Visit (INDEPENDENT_AMBULATORY_CARE_PROVIDER_SITE_OTHER): Payer: Medicaid Other | Admitting: Obstetrics

## 2022-04-04 ENCOUNTER — Other Ambulatory Visit (HOSPITAL_COMMUNITY)
Admission: RE | Admit: 2022-04-04 | Discharge: 2022-04-04 | Disposition: A | Discharge status: Home / Self Care | Payer: Medicaid Other | Source: Ambulatory Visit | Attending: Obstetrics | Admitting: Obstetrics

## 2022-04-04 ENCOUNTER — Encounter: Payer: Self-pay | Admitting: Obstetrics

## 2022-04-04 VITALS — BP 120/79 | HR 94 | Wt 177.0 lb

## 2022-04-04 DIAGNOSIS — Z3480 Encounter for supervision of other normal pregnancy, unspecified trimester: Secondary | ICD-10-CM | POA: Insufficient documentation

## 2022-04-04 DIAGNOSIS — Z8632 Personal history of gestational diabetes: Secondary | ICD-10-CM

## 2022-04-04 DIAGNOSIS — O219 Vomiting of pregnancy, unspecified: Secondary | ICD-10-CM

## 2022-04-04 MED ORDER — DOXYLAMINE-PYRIDOXINE 10-10 MG PO TBEC: 2.0000 | DELAYED_RELEASE_TABLET | Freq: Every day | ORAL | 5 refills | Status: DC

## 2022-04-04 NOTE — Progress Notes (Signed)
Patient presents for New OB visit. Patient has no concerns. This is a planned pregnancy. Patient is married. ?

## 2022-04-04 NOTE — Progress Notes (Signed)
Subjective:  ? ? Eileen Morris is being seen today for her first obstetrical visit.  This is a planned pregnancy. She is at [redacted]w[redacted]d gestation. Her obstetrical history is significant for gestational diabetes - diet controlled and large for gestational age. Relationship with FOB: spouse, living together. Patient does intend to breast feed. Pregnancy history fully reviewed. ? ?The information documented in the HPI was reviewed and verified. ? ?Menstrual History: ?OB History   ? ? Gravida  ?5  ? Para  ?2  ? Term  ?2  ? Preterm  ?   ? AB  ?2  ? Living  ?2  ?  ? ? SAB  ?2  ? IAB  ?   ? Ectopic  ?   ? Multiple  ?0  ? Live Births  ?2  ?   ?  ?  ?  ? ?Patient's last menstrual period was 01/15/2022. ?  ? ?Past Medical History:  ?Diagnosis Date  ? Gestational diabetes   ? diet controlled  ? History of hand surgery   ?  ?Past Surgical History:  ?Procedure Laterality Date  ? HAND SURGERY    ?  ?(Not in a hospital admission) ? ?No Known Allergies  ?Social History  ? ?Tobacco Use  ? Smoking status: Never  ? Smokeless tobacco: Never  ?Substance Use Topics  ? Alcohol use: No  ?  ?Family History  ?Problem Relation Age of Onset  ? Hypertension Mother   ? Cancer Paternal Grandmother   ? Hypertension Maternal Aunt   ?  ? ?Review of Systems ?Constitutional: negative for weight loss ?Gastrointestinal: positive for nausea and vomiting ?Genitourinary:negative for genital lesions and vaginal discharge and dysuria ?Musculoskeletal:negative for back pain ?Behavioral/Psych: negative for abusive relationship, depression, illegal drug usage and tobacco use  ?  ?Objective:  ? ? BP 120/79   Pulse 94   Wt 177 lb (80.3 kg)   LMP 01/15/2022   BMI 30.38 kg/m?  ?General Appearance:    Alert, cooperative, no distress, appears stated age  ?Head:    Normocephalic, without obvious abnormality, atraumatic  ?Eyes:    PERRL, conjunctiva/corneas clear, EOM's intact, fundi  ?  benign, both eyes  ?Ears:    Normal TM's and external ear canals, both ears   ?Nose:   Nares normal, septum midline, mucosa normal, no drainage    or sinus tenderness  ?Throat:   Lips, mucosa, and tongue normal; teeth and gums normal  ?Neck:   Supple, symmetrical, trachea midline, no adenopathy;  ?  thyroid:  no enlargement/tenderness/nodules; no carotid ?  bruit or JVD  ?Back:     Symmetric, no curvature, ROM normal, no CVA tenderness  ?Lungs:     Clear to auscultation bilaterally, respirations unlabored  ?Chest Wall:    No tenderness or deformity  ? Heart:    Regular rate and rhythm, S1 and S2 normal, no murmur, rub   or gallop  ?Breast Exam:    No tenderness, masses, or nipple abnormality  ?Abdomen:     Soft, non-tender, bowel sounds active all four quadrants,  ?  no masses, no organomegaly  ?Genitalia:    Normal female without lesion, discharge or tenderness  ?Extremities:   Extremities normal, atraumatic, no cyanosis or edema  ?Pulses:   2+ and symmetric all extremities  ?Skin:   Skin color, texture, turgor normal, no rashes or lesions  ?Lymph nodes:   Cervical, supraclavicular, and axillary nodes normal  ?Neurologic:   CNII-XII intact, normal strength, sensation  and reflexes  ?  throughout  ? ?  ? Lab Review ?Urine pregnancy test ?Labs reviewed no ?Radiologic studies reviewed yes ? ?Assessment:  ? ? Pregnancy at [redacted]w[redacted]d weeks  ?  ?Plan:  ? ? 1. Supervision of other normal pregnancy, antepartum ?Rx: ?- Cytology - PAP( [place]) ?- Cervicovaginal ancillary only( [place]) ?- CBC/D/Plt+RPR+Rh+ABO+Rub Ab... ?- Genetic Screening ?- Culture, OB Urine ?- Hemoglobin A1c ? ?2. Nausea and vomiting during pregnancy ?Rx: ?- Doxylamine-Pyridoxine (DICLEGIS) 10-10 MG TBEC; Take 2 tablets by mouth at bedtime. If symptoms persist, add one tablet in the morning and one in the afternoon  Dispense: 100 tablet; Refill: 5 ? ?3. History of gestational diabetes mellitus (GDM) ?  ? ?Prenatal vitamins.  ?Counseling provided regarding continued use of seat belts, cessation of alcohol consumption, smoking or  use of illicit drugs; infection precautions i.e., influenza/TDAP immunizations, toxoplasmosis,CMV, parvovirus, listeria and varicella; workplace safety, exercise during pregnancy; routine dental care, safe medications, sexual activity, hot tubs, saunas, pools, travel, caffeine use, fish and methlymercury, potential toxins, hair treatments, varicose veins ?Weight gain recommendations per IOM guidelines reviewed: underweight/BMI< 18.5--> gain 28 - 40 lbs; normal weight/BMI 18.5 - 24.9--> gain 25 - 35 lbs; overweight/BMI 25 - 29.9--> gain 15 - 25 lbs; obese/BMI >30->gain  11 - 20 lbs ?Problem list reviewed and updated. ?FIRST/CF mutation testing/NIPT/QUAD SCREEN/fragile X/Ashkenazi Jewish population testing/Spinal muscular atrophy discussed: requested. ?Role of ultrasound in pregnancy discussed; fetal survey: requested. ?Amniocentesis discussed: not indicated. ? ?Meds ordered this encounter  ?Medications  ? Doxylamine-Pyridoxine (DICLEGIS) 10-10 MG TBEC  ?  Sig: Take 2 tablets by mouth at bedtime. If symptoms persist, add one tablet in the morning and one in the afternoon  ?  Dispense:  100 tablet  ?  Refill:  5  ? ?Orders Placed This Encounter  ?Procedures  ? Culture, OB Urine  ? CBC/D/Plt+RPR+Rh+ABO+Rub Ab...  ? Genetic Screening  ? Hemoglobin A1c  ? ? ?Follow up in 4 weeks. ? ?I have spent a total of 20 minutes of face-to-face time, excluding clinical staff time, reviewing notes and preparing to see patient, ordering tests and/or medications, and counseling the patient.  ? ?Brock Bad, MD ?04/04/2022 3:58 PM  ?

## 2022-04-05 ENCOUNTER — Other Ambulatory Visit: Payer: Self-pay | Admitting: Emergency Medicine

## 2022-04-05 DIAGNOSIS — O219 Vomiting of pregnancy, unspecified: Secondary | ICD-10-CM

## 2022-04-05 LAB — CERVICOVAGINAL ANCILLARY ONLY
Bacterial Vaginitis (gardnerella): NEGATIVE
Candida Glabrata: NEGATIVE
Candida Vaginitis: NEGATIVE
Chlamydia: NEGATIVE
Comment: NEGATIVE
Comment: NEGATIVE
Comment: NEGATIVE
Comment: NEGATIVE
Comment: NEGATIVE
Comment: NORMAL
Neisseria Gonorrhea: NEGATIVE
Trichomonas: NEGATIVE

## 2022-04-05 LAB — CBC/D/PLT+RPR+RH+ABO+RUBIGG...
Antibody Screen: NEGATIVE
Basophils Absolute: 0 10*3/uL (ref 0.0–0.2)
Basos: 0 %
EOS (ABSOLUTE): 0.1 10*3/uL (ref 0.0–0.4)
Eos: 1 %
HCV Ab: NONREACTIVE
HIV Screen 4th Generation wRfx: NONREACTIVE
Hematocrit: 34.1 % (ref 34.0–46.6)
Hemoglobin: 11.7 g/dL (ref 11.1–15.9)
Hepatitis B Surface Ag: NEGATIVE
Immature Grans (Abs): 0 10*3/uL (ref 0.0–0.1)
Immature Granulocytes: 0 %
Lymphocytes Absolute: 1.2 10*3/uL (ref 0.7–3.1)
Lymphs: 11 %
MCH: 30.2 pg (ref 26.6–33.0)
MCHC: 34.3 g/dL (ref 31.5–35.7)
MCV: 88 fL (ref 79–97)
Monocytes Absolute: 0.7 10*3/uL (ref 0.1–0.9)
Monocytes: 6 %
Neutrophils Absolute: 8.8 10*3/uL — ABNORMAL HIGH (ref 1.4–7.0)
Neutrophils: 82 %
Platelets: 250 10*3/uL (ref 150–450)
RBC: 3.88 x10E6/uL (ref 3.77–5.28)
RDW: 12.9 % (ref 11.7–15.4)
RPR Ser Ql: NONREACTIVE
Rh Factor: POSITIVE
Rubella Antibodies, IGG: 1.52 index (ref >0.99)
WBC: 10.8 10*3/uL (ref 3.4–10.8)

## 2022-04-05 LAB — HEMOGLOBIN A1C
Est. average glucose Bld gHb Est-mCnc: 108 mg/dL
Hgb A1c MFr Bld: 5.4 % (ref 4.8–5.6)

## 2022-04-05 LAB — CYTOLOGY - PAP: Diagnosis: NEGATIVE

## 2022-04-05 LAB — HCV INTERPRETATION

## 2022-04-05 MED ORDER — DICLEGIS 10-10 MG PO TBEC: 2.0000 | DELAYED_RELEASE_TABLET | Freq: Every day | ORAL | 5 refills | Status: AC

## 2022-04-06 LAB — URINE CULTURE, OB REFLEX

## 2022-04-06 LAB — CULTURE, OB URINE

## 2022-04-10 ENCOUNTER — Encounter: Payer: Self-pay | Admitting: Obstetrics

## 2022-05-02 ENCOUNTER — Ambulatory Visit (INDEPENDENT_AMBULATORY_CARE_PROVIDER_SITE_OTHER): Payer: Medicaid Other | Admitting: Women's Health

## 2022-05-02 VITALS — BP 120/75 | HR 98 | Wt 180.0 lb

## 2022-05-02 DIAGNOSIS — Z8632 Personal history of gestational diabetes: Secondary | ICD-10-CM

## 2022-05-02 DIAGNOSIS — Z3A15 15 weeks gestation of pregnancy: Secondary | ICD-10-CM

## 2022-05-02 DIAGNOSIS — Z3481 Encounter for supervision of other normal pregnancy, first trimester: Secondary | ICD-10-CM

## 2022-05-02 NOTE — Patient Instructions (Signed)
Maternity Assessment Unit (MAU) ? ?The Maternity Assessment Unit (MAU) is located at the [facility] at [facility]. The address is: [address], [address], [place], [place] [address]. Please see map below for additional directions. ? ? ? ?The Maternity Assessment Unit is designed to help you during your pregnancy, and for up to 6 weeks after delivery, with any pregnancy- or postpartum-related emergencies, if you think you are in labor, or if your water has broken. For example, if you experience nausea and vomiting, vaginal bleeding, severe abdominal or pelvic pain, elevated blood pressure or other problems related to your pregnancy or postpartum time, please come to the Maternity Assessment Unit for assistance. ? ? ? ? ? ? ?Childbirth Education Options: ?[facility] Classes:  ?Childbirth education classes can help you get ready for a positive parenting experience. You can also meet other expectant parents and get free stuff for your baby. Each class runs for five weeks on the same night and costs $45 for the mother-to-be and her support person. Medicaid covers the cost if you are eligible. Call [phone] to register. ?[facility] Childbirth Education: ?Classes can vary in availability and schedule is subject to change. For most up-to-date information please visit [email or web address] to review and register.  ? ? ? ? ? ? ? ? ? ? ? ?

## 2022-05-02 NOTE — Progress Notes (Signed)
Subjective:  Eileen Morris is a 26 y.o. [phone] at [redacted]w[redacted]d being seen today for ongoing prenatal care.  She is currently monitored for the following issues for this low-risk pregnancy and has History of gestational diabetes and Supervision of normal pregnancy in first trimester on their problem list.  Patient reports no complaints.  Contractions: Not present. Vag. Bleeding: None.   . Denies leaking of fluid.   The following portions of the patient's history were reviewed and updated as appropriate: allergies, current medications, past family history, past medical history, past social history, past surgical history and problem list. Problem list updated.  Objective:   Vitals:   05/02/22 1358  BP: 120/75  Pulse: 98  Weight: 180 lb (81.6 kg)    Fetal Status: Fetal Heart Rate (bpm): 160         General:  Alert, oriented and cooperative. Patient is in no acute distress.  Skin: Skin is warm and dry. No rash noted.   Cardiovascular: Normal heart rate noted  Respiratory: Normal respiratory effort, no problems with respiration noted  Abdomen: Soft, gravid, appropriate for gestational age. Pain/Pressure: Present     Pelvic: Vag. Bleeding: None     Cervical exam deferred        Extremities: Normal range of motion.  Edema: None  Mental Status: Normal mood and affect. Normal behavior. Normal judgment and thought content.   Urinalysis:      Assessment and Plan:  Pregnancy: [id number] at [redacted]w[redacted]d  1. Encounter for supervision of other normal pregnancy in first trimester -CBE info given - AFP, Serum, Open Spina Bifida -A1C d/t hx GDM -anatomy scan ordered     03/21/2022    1:36 PM  PHQ9 SCORE ONLY  PHQ-9 Total Score 0      03/21/2022    1:36 PM  GAD 7 : Generalized Anxiety Score  Nervous, Anxious, on Edge 0  Control/stop worrying 0  Worry too much - different things 0  Trouble relaxing 0  Restless 0  Easily annoyed or irritable 0  Afraid - awful might happen 0  Total GAD 7 Score 0    Preterm labor symptoms and general obstetric precautions including but not limited to vaginal bleeding, contractions, leaking of fluid and fetal movement were reviewed in detail with the patient. I discussed the assessment and treatment plan with the patient. The patient was provided an opportunity to ask questions and all were answered. The patient agreed with the plan and demonstrated an understanding of the instructions. The patient was advised to call back or seek an in-person office evaluation/go to MAU at [facility] for any urgent or concerning symptoms. Please refer to After Visit Summary for other counseling recommendations.  Return in about 4 weeks (around 05/30/2022) for in-person LOB/APP OK - needs anatomy scan scheduled.   Dorsie Sethi, Odie Sera, NP

## 2022-05-04 LAB — AFP, SERUM, OPEN SPINA BIFIDA
AFP MoM: 0.7
AFP Value: 19.4 ng/mL
Gest. Age on Collection Date: 15.2 weeks
Maternal Age At EDD: 26.3 yr
OSBR Risk 1 IN: 10000
Test Results:: NEGATIVE
Weight: 180 [lb_av]

## 2022-05-04 LAB — HEMOGLOBIN A1C
Est. average glucose Bld gHb Est-mCnc: 105 mg/dL
Hgb A1c MFr Bld: 5.3 % (ref 4.8–5.6)

## 2022-05-11 ENCOUNTER — Other Ambulatory Visit: Payer: Self-pay | Admitting: *Deleted

## 2022-05-11 ENCOUNTER — Other Ambulatory Visit (HOSPITAL_COMMUNITY)
Admission: RE | Admit: 2022-05-11 | Discharge: 2022-05-11 | Disposition: A | Discharge status: Home / Self Care | Payer: Medicaid Other | Source: Ambulatory Visit | Attending: Obstetrics and Gynecology | Admitting: Obstetrics and Gynecology

## 2022-05-11 ENCOUNTER — Ambulatory Visit (INDEPENDENT_AMBULATORY_CARE_PROVIDER_SITE_OTHER): Payer: Medicaid Other | Admitting: *Deleted

## 2022-05-11 VITALS — BP 119/73 | HR 84

## 2022-05-11 DIAGNOSIS — N760 Acute vaginitis: Secondary | ICD-10-CM | POA: Insufficient documentation

## 2022-05-11 NOTE — Progress Notes (Cosign Needed)
SUBJECTIVE:  26 y.o. female complains of white vaginal discharge for 3 day(s). Denies abnormal vaginal bleeding or significant pelvic pain or fever. No UTI symptoms. Denies history of known exposure to STD.  Patient's last menstrual period was 01/15/2022.  OBJECTIVE:  She appears well, afebrile. Urine dipstick: not done.  ASSESSMENT:  Vaginal Discharge  Vaginal Odor   PLAN:  GC, chlamydia, trichomonas, BVAG, CVAG probe sent to lab. Treatment: To be determined once lab results are received ROV prn if symptoms persist or worsen.

## 2022-05-11 NOTE — Progress Notes (Signed)
Agree with nurses's documentation of this patient's clinic encounter.  Annalise Mcdiarmid L, MD  

## 2022-05-14 ENCOUNTER — Telehealth: Payer: Self-pay

## 2022-05-14 ENCOUNTER — Ambulatory Visit: Payer: Medicaid Other

## 2022-05-14 ENCOUNTER — Other Ambulatory Visit: Payer: Self-pay

## 2022-05-14 DIAGNOSIS — B9689 Other specified bacterial agents as the cause of diseases classified elsewhere: Secondary | ICD-10-CM

## 2022-05-14 DIAGNOSIS — B3731 Acute candidiasis of vulva and vagina: Secondary | ICD-10-CM

## 2022-05-14 LAB — CERVICOVAGINAL ANCILLARY ONLY
Bacterial Vaginitis (gardnerella): POSITIVE — AB
Candida Glabrata: NEGATIVE
Candida Vaginitis: POSITIVE — AB
Comment: NEGATIVE
Comment: NEGATIVE
Comment: NEGATIVE

## 2022-05-14 MED ORDER — TERCONAZOLE 0.8 % VA CREA: 1.0000 | TOPICAL_CREAM | Freq: Every day | VAGINAL | 0 refills | Status: DC

## 2022-05-14 MED ORDER — METRONIDAZOLE 500 MG PO TABS: 500.0000 mg | ORAL_TABLET | Freq: Two times a day (BID) | ORAL | 0 refills | Status: AC

## 2022-05-14 NOTE — Telephone Encounter (Signed)
-----   Message from Hermina Staggers, MD sent at 05/14/2022  4:26 PM EDT ----- Please send in Rx for BV and yeast Pt is aware.  Thanks Casimiro Needle

## 2022-05-14 NOTE — Telephone Encounter (Signed)
Pt informed of results and rx sent

## 2022-05-28 ENCOUNTER — Ambulatory Visit (INDEPENDENT_AMBULATORY_CARE_PROVIDER_SITE_OTHER): Payer: Medicaid Other | Admitting: Obstetrics and Gynecology

## 2022-05-28 ENCOUNTER — Encounter: Payer: Self-pay | Admitting: Obstetrics and Gynecology

## 2022-05-28 VITALS — BP 112/68 | HR 87 | Wt 183.0 lb

## 2022-05-28 DIAGNOSIS — Z8632 Personal history of gestational diabetes: Secondary | ICD-10-CM

## 2022-05-28 DIAGNOSIS — Z3481 Encounter for supervision of other normal pregnancy, first trimester: Secondary | ICD-10-CM

## 2022-05-29 ENCOUNTER — Other Ambulatory Visit: Payer: Self-pay | Admitting: Women's Health

## 2022-05-29 ENCOUNTER — Ambulatory Visit: Payer: Medicaid Other | Attending: Women's Health

## 2022-05-29 DIAGNOSIS — Z363 Encounter for antenatal screening for malformations: Secondary | ICD-10-CM | POA: Insufficient documentation

## 2022-05-29 DIAGNOSIS — O09292 Supervision of pregnancy with other poor reproductive or obstetric history, second trimester: Secondary | ICD-10-CM

## 2022-05-29 DIAGNOSIS — O24410 Gestational diabetes mellitus in pregnancy, diet controlled: Secondary | ICD-10-CM | POA: Insufficient documentation

## 2022-05-29 DIAGNOSIS — Z3481 Encounter for supervision of other normal pregnancy, first trimester: Secondary | ICD-10-CM

## 2022-05-29 DIAGNOSIS — Z3A18 18 weeks gestation of pregnancy: Secondary | ICD-10-CM | POA: Diagnosis not present

## 2022-05-29 DIAGNOSIS — Z8632 Personal history of gestational diabetes: Secondary | ICD-10-CM | POA: Insufficient documentation

## 2022-06-25 ENCOUNTER — Encounter: Payer: Self-pay | Admitting: Obstetrics and Gynecology

## 2022-06-25 ENCOUNTER — Ambulatory Visit (INDEPENDENT_AMBULATORY_CARE_PROVIDER_SITE_OTHER): Payer: Medicaid Other | Admitting: Obstetrics and Gynecology

## 2022-06-25 VITALS — BP 109/74 | HR 96 | Wt 189.0 lb

## 2022-06-25 DIAGNOSIS — Z8632 Personal history of gestational diabetes: Secondary | ICD-10-CM

## 2022-06-25 DIAGNOSIS — Z3481 Encounter for supervision of other normal pregnancy, first trimester: Secondary | ICD-10-CM

## 2022-06-25 NOTE — Progress Notes (Signed)
   PRENATAL VISIT NOTE  Subjective:  Eileen Morris is a 26 y.o. [phone] at [redacted]w[redacted]d being seen today for ongoing prenatal care.  She is currently monitored for the following issues for this low-risk pregnancy and has History of gestational diabetes and Supervision of normal pregnancy in first trimester on their problem list.  Patient reports headache.  Contractions: Not present. Vag. Bleeding: None.  Movement: Present. Denies leaking of fluid.   The following portions of the patient's history were reviewed and updated as appropriate: allergies, current medications, past family history, past medical history, past social history, past surgical history and problem list.   Objective:   Vitals:   06/25/22 1348  BP: 109/74  Pulse: 96  Weight: 189 lb (85.7 kg)    Fetal Status: Fetal Heart Rate (bpm): 153 Fundal Height: 24 cm Movement: Present     General:  Alert, oriented and cooperative. Patient is in no acute distress.  Skin: Skin is warm and dry. No rash noted.   Cardiovascular: Normal heart rate noted  Respiratory: Normal respiratory effort, no problems with respiration noted  Abdomen: Soft, gravid, appropriate for gestational age.  Pain/Pressure: Present     Pelvic: Cervical exam deferred        Extremities: Normal range of motion.  Edema: Trace  Mental Status: Normal mood and affect. Normal behavior. Normal judgment and thought content.   Assessment and Plan:  Pregnancy: [id number] at [redacted]w[redacted]d 1. Encounter for supervision of other normal pregnancy in first trimester Patient is doing well She reports having emesis and diarrhea last week and onset of a headache for the past few days. She takes tylenol which helps the headaches Advised patient to increase fluid intake along with extra strength tylenol usage Patient to contact us prior to next appointment if no improvement Third trimester labs with glucola next visit  2. History of gestational diabetes Normal early A1c  Preterm  labor symptoms and general obstetric precautions including but not limited to vaginal bleeding, contractions, leaking of fluid and fetal movement were reviewed in detail with the patient. Please refer to After Visit Summary for other counseling recommendations.   Return in about 4 weeks (around 07/23/2022) for in person, ROB, Low risk, 2 hr glucola next visit.  No future appointments.  Catalina Antigua, MD

## 2022-06-25 NOTE — Progress Notes (Signed)
ROB c/o headache 5/10 x 4 days

## 2022-07-24 ENCOUNTER — Other Ambulatory Visit: Payer: Medicaid Other

## 2022-07-24 ENCOUNTER — Other Ambulatory Visit (HOSPITAL_COMMUNITY)
Admission: RE | Admit: 2022-07-24 | Discharge: 2022-07-24 | Disposition: A | Discharge status: Home / Self Care | Payer: Medicaid Other | Source: Ambulatory Visit | Attending: Obstetrics & Gynecology | Admitting: Obstetrics & Gynecology

## 2022-07-24 ENCOUNTER — Ambulatory Visit (INDEPENDENT_AMBULATORY_CARE_PROVIDER_SITE_OTHER): Payer: Medicaid Other | Admitting: Family Medicine

## 2022-07-24 VITALS — BP 108/67 | HR 92 | Wt 196.0 lb

## 2022-07-24 DIAGNOSIS — Z8632 Personal history of gestational diabetes: Secondary | ICD-10-CM

## 2022-07-24 DIAGNOSIS — N898 Other specified noninflammatory disorders of vagina: Secondary | ICD-10-CM | POA: Insufficient documentation

## 2022-07-24 DIAGNOSIS — O26893 Other specified pregnancy related conditions, third trimester: Secondary | ICD-10-CM | POA: Diagnosis not present

## 2022-07-24 DIAGNOSIS — Z3483 Encounter for supervision of other normal pregnancy, third trimester: Secondary | ICD-10-CM

## 2022-07-24 DIAGNOSIS — Z3A27 27 weeks gestation of pregnancy: Secondary | ICD-10-CM

## 2022-07-24 NOTE — Progress Notes (Signed)
ROB/GTT, declined TDAP vaccine.  C/o peeing on herself whenever she coughs.

## 2022-07-24 NOTE — Progress Notes (Addendum)
   PRENATAL VISIT NOTE  Subjective:  Eileen Morris is a 26 y.o. [phone] at [redacted]w[redacted]d being seen today for ongoing prenatal care.  She is currently monitored for the following issues for this low-risk pregnancy and has History of gestational diabetes and Supervision of normal pregnancy in first trimester on their problem list.  Patient reports  leaking fluid with coughing and jumping. Had this issue prior to this pregnancy but after her last pregnancy. Also stating she had vaginal discomfort after finishing treatment for BV and would like to be checked for it again today .  Contractions: Not present. Vag. Bleeding: None.  Movement: Present. Denies leaking of fluid.   The following portions of the patient's history were reviewed and updated as appropriate: allergies, current medications, past family history, past medical history, past social history, past surgical history and problem list.   Objective:   Vitals:   07/24/22 0829  BP: 108/67  Pulse: 92  Weight: 88.9 kg    Fetal Status: Fetal Heart Rate (bpm): 144 Fundal Height: 28 cm Movement: Present     General:  Alert, oriented and cooperative. Patient is in no acute distress.  Skin: Skin is warm and dry. No rash noted.   Cardiovascular: Normal heart rate noted  Respiratory: Normal respiratory effort, no problems with respiration noted  Abdomen: Soft, gravid, appropriate for gestational age.  Pain/Pressure: Absent     Pelvic:  normal external genitalia, vulva, vagina, cervix, uterus and adnexa, VAGINA: normal appearing vagina with normal color and discharge, no lesions, exam chaperoned by Okey Regal.         Extremities: Normal range of motion.  Edema: Trace  Mental Status: Normal mood and affect. Normal behavior. Normal judgment and thought content.   Assessment and Plan:  Pregnancy: [id number] at [redacted]w[redacted]d  1. Encounter for supervision of other normal pregnancy in third trimester - RPR - CBC - HIV antibody (with reflex)  2. History of  gestational diabetes - Glucose Tolerance, 2 Hours w/1 Hour  3. Vaginal discharge during pregnancy in third trimester Leakage of fluid with jumping and coughing. Hx of BV. Endorses hx of stress incontinence prior to this pregnancy but after last pregnancy.  - Nitrazine Test negative - Cervicovaginal ancillary only - Consider pelvic floor PT PP - Cervicovaginal ancillary only( [place])  4. [redacted] weeks gestation of pregnancy   Preterm labor symptoms and general obstetric precautions including but not limited to vaginal bleeding, contractions, leaking of fluid and fetal movement were reviewed in detail with the patient. Please refer to After Visit Summary for other counseling recommendations.   Return in about 2 weeks (around 08/07/2022) for LROB follow up.  No future appointments.   Usha Slager Autry-Lott, DO

## 2022-07-24 NOTE — Addendum Note (Signed)
Addended by: Maretta Bees on: 07/24/2022 09:55 AM   Modules accepted: Orders

## 2022-07-25 LAB — CERVICOVAGINAL ANCILLARY ONLY
Bacterial Vaginitis (gardnerella): POSITIVE — AB
Candida Glabrata: NEGATIVE
Candida Vaginitis: POSITIVE — AB
Chlamydia: NEGATIVE
Comment: NEGATIVE
Comment: NEGATIVE
Comment: NEGATIVE
Comment: NEGATIVE
Comment: NEGATIVE
Comment: NORMAL
Neisseria Gonorrhea: NEGATIVE
Trichomonas: NEGATIVE

## 2022-07-25 LAB — CBC
Hematocrit: 36.6 % (ref 34.0–46.6)
Hemoglobin: 11.9 g/dL (ref 11.1–15.9)
MCH: 30.5 pg (ref 26.6–33.0)
MCHC: 32.5 g/dL (ref 31.5–35.7)
MCV: 94 fL (ref 79–97)
Platelets: 228 10*3/uL (ref 150–450)
RBC: 3.9 x10E6/uL (ref 3.77–5.28)
RDW: 13.1 % (ref 11.7–15.4)
WBC: 9.9 10*3/uL (ref 3.4–10.8)

## 2022-07-25 LAB — HIV ANTIBODY (ROUTINE TESTING W REFLEX): HIV Screen 4th Generation wRfx: NONREACTIVE

## 2022-07-25 LAB — GLUCOSE TOLERANCE, 2 HOURS W/ 1HR
Glucose, 1 hour: 155 mg/dL (ref 70–179)
Glucose, 2 hour: 111 mg/dL (ref 70–152)
Glucose, Fasting: 76 mg/dL (ref 70–91)

## 2022-07-25 LAB — RPR: RPR Ser Ql: NONREACTIVE

## 2022-07-26 ENCOUNTER — Other Ambulatory Visit: Payer: Self-pay | Admitting: Obstetrics and Gynecology

## 2022-07-26 MED ORDER — METRONIDAZOLE 500 MG PO TABS: 500.0000 mg | ORAL_TABLET | Freq: Two times a day (BID) | ORAL | 0 refills | Status: DC

## 2022-07-26 MED ORDER — TERCONAZOLE 0.8 % VA CREA: 1.0000 | TOPICAL_CREAM | Freq: Every day | VAGINAL | 0 refills | Status: DC

## 2022-08-08 ENCOUNTER — Ambulatory Visit (INDEPENDENT_AMBULATORY_CARE_PROVIDER_SITE_OTHER): Payer: Self-pay | Admitting: Obstetrics and Gynecology

## 2022-08-08 ENCOUNTER — Encounter: Payer: Self-pay | Admitting: Obstetrics and Gynecology

## 2022-08-08 VITALS — BP 124/74 | HR 105 | Wt 199.1 lb

## 2022-08-08 DIAGNOSIS — Z3481 Encounter for supervision of other normal pregnancy, first trimester: Secondary | ICD-10-CM

## 2022-08-08 DIAGNOSIS — Z3A29 29 weeks gestation of pregnancy: Secondary | ICD-10-CM

## 2022-08-08 DIAGNOSIS — Z8632 Personal history of gestational diabetes: Secondary | ICD-10-CM

## 2022-08-08 NOTE — Progress Notes (Signed)
Patient presents for ROB. Patient states that she fell on 8/27. Patient states that tripped over a tree trunk and fell. She state that when she fell her knee hit her belly really hard. Noticed some cramping after the fall, denies having any bleeding, endorses good fetal movement now. No other concerns.

## 2022-08-08 NOTE — Patient Instructions (Signed)

## 2022-08-08 NOTE — Progress Notes (Signed)
Subjective:  Eileen Morris is a 26 y.o. [id number] at [redacted]w[redacted]d being seen today for ongoing prenatal care.  She is currently monitored for the following issues for this low-risk pregnancy and has History of gestational diabetes and Supervision of normal pregnancy in first trimester on their problem list.  Patient reports no complaints.  Contractions: Not present. Vag. Bleeding: None.  Movement: Present. Denies leaking of fluid.   The following portions of the patient's history were reviewed and updated as appropriate: allergies, current medications, past family history, past medical history, past social history, past surgical history and problem list. Problem list updated.  Objective:   Vitals:   08/08/22 1338  BP: 124/74  Pulse: (!) 105  Weight: 199 lb 1.6 oz (90.3 kg)    Fetal Status:     Movement: Present     General:  Alert, oriented and cooperative. Patient is in no acute distress.  Skin: Skin is warm and dry. No rash noted.   Cardiovascular: Normal heart rate noted  Respiratory: Normal respiratory effort, no problems with respiration noted  Abdomen: Soft, gravid, appropriate for gestational age. Pain/Pressure: Present     Pelvic:  Cervical exam deferred        Extremities: Normal range of motion.  Edema: Trace  Mental Status: Normal mood and affect. Normal behavior. Normal judgment and thought content.   Urinalysis:      Assessment and Plan:  Pregnancy: [id number] at [redacted]w[redacted]d  1. Encounter for supervision of other normal pregnancy in first trimester Stable Information on Tdap provided  2. History of gestational diabetes Normal 2 hr GTT  Preterm labor symptoms and general obstetric precautions including but not limited to vaginal bleeding, contractions, leaking of fluid and fetal movement were reviewed in detail with the patient. Please refer to After Visit Summary for other counseling recommendations.  Return in about 2 weeks (around 08/22/2022) for OB visit, face to face, any  provider.   Hermina Staggers, MD

## 2022-08-22 ENCOUNTER — Ambulatory Visit (INDEPENDENT_AMBULATORY_CARE_PROVIDER_SITE_OTHER): Payer: Self-pay | Admitting: Student

## 2022-08-22 VITALS — BP 115/69 | HR 91 | Wt 197.0 lb

## 2022-08-22 DIAGNOSIS — Z3483 Encounter for supervision of other normal pregnancy, third trimester: Secondary | ICD-10-CM

## 2022-08-22 DIAGNOSIS — Z8632 Personal history of gestational diabetes: Secondary | ICD-10-CM

## 2022-08-22 DIAGNOSIS — Z3A31 31 weeks gestation of pregnancy: Secondary | ICD-10-CM

## 2022-08-22 NOTE — Progress Notes (Signed)
   PRENATAL VISIT NOTE  Subjective:  Eileen Morris is a 26 y.o. [phone] at [redacted]w[redacted]d being seen today for ongoing prenatal care.  She is currently monitored for the following issues for this low-risk pregnancy and has History of gestational diabetes and Supervision of normal pregnancy in first trimester on their problem list.  Patient reports no complaints.  Contractions: Not present. Vag. Bleeding: None.  Movement: Present. Denies leaking of fluid.   The following portions of the patient's history were reviewed and updated as appropriate: allergies, current medications, past family history, past medical history, past social history, past surgical history and problem list.   Objective:   Vitals:   08/22/22 1624  BP: 115/69  Pulse: 91  Weight: 197 lb (89.4 kg)    Fetal Status: Fetal Heart Rate (bpm): 144 Fundal Height: 32 cm Movement: Present     General:  Alert, oriented and cooperative. Patient is in no acute distress.  Skin: Skin is warm and dry. No rash noted.   Cardiovascular: Normal heart rate noted  Respiratory: Normal respiratory effort, no problems with respiration noted  Abdomen: Soft, gravid, appropriate for gestational age.  Pain/Pressure: Present     Pelvic: Cervical exam deferred        Extremities: Normal range of motion.  Edema: Trace  Mental Status: Normal mood and affect. Normal behavior. Normal judgment and thought content.   Assessment and Plan:  Pregnancy: [id number] at [redacted]w[redacted]d 1. Encounter for supervision of other normal pregnancy in third trimester - Doing well, no complaints - Frequent and vigorous fetal movement  2. [redacted] weeks gestation of pregnancy - Routine follow-up  3. History of gestational diabetes mellitus (GDM) - Passed 2hr gtt  Preterm labor symptoms and general obstetric precautions including but not limited to vaginal bleeding, contractions, leaking of fluid and fetal movement were reviewed in detail with the patient. Please refer to After  Visit Summary for other counseling recommendations.   Return in about 2 weeks (around 09/05/2022) for LOB, IN-PERSON.  Future Appointments  Date Time Provider [place]  09/06/2022  3:30 PM Constant, Vickii Chafe, MD [place] None  09/18/2022  4:10 PM Constant, Vickii Chafe, MD [place] None  09/25/2022  4:10 PM Leftwich-Kirby, Kathie Dike, CNM [facility] None  10/02/2022  4:10 PM Donnamae Jude, MD [place] None  10/09/2022  4:10 PM Leftwich-Kirby, Kathie Dike, CNM [place] None  10/16/2022  4:10 PM Deloris Ping, CNM [place] None  10/23/2022  3:50 PM Leftwich-Kirby, Kathie Dike, CNM [place] None    Johnston Ebbs, NP

## 2022-08-22 NOTE — Patient Instructions (Addendum)

## 2022-09-06 ENCOUNTER — Encounter: Payer: Self-pay | Admitting: Obstetrics and Gynecology

## 2022-09-06 ENCOUNTER — Ambulatory Visit (INDEPENDENT_AMBULATORY_CARE_PROVIDER_SITE_OTHER): Payer: Self-pay | Admitting: Obstetrics and Gynecology

## 2022-09-06 VITALS — BP 123/73 | HR 96 | Wt 200.0 lb

## 2022-09-06 DIAGNOSIS — Z3481 Encounter for supervision of other normal pregnancy, first trimester: Secondary | ICD-10-CM

## 2022-09-06 DIAGNOSIS — Z3A33 33 weeks gestation of pregnancy: Secondary | ICD-10-CM

## 2022-09-06 LAB — POCT URINALYSIS DIPSTICK
Bilirubin, UA: NEGATIVE
Blood, UA: NEGATIVE
Glucose, UA: NEGATIVE
Ketones, UA: NEGATIVE
Leukocytes, UA: NEGATIVE
Nitrite, UA: NEGATIVE
Protein, UA: NEGATIVE
Spec Grav, UA: 1.015 (ref 1.010–1.025)
Urobilinogen, UA: 0.2 E.U./dL
pH, UA: 6 (ref 5.0–8.0)

## 2022-09-06 MED ORDER — COMFORT FIT MATERNITY SUPP MED MISC: 0 refills | Status: AC

## 2022-09-06 NOTE — Progress Notes (Signed)
   PRENATAL VISIT NOTE  Subjective:  Eileen Morris is a 26 y.o. [phone] at [redacted]w[redacted]d being seen today for ongoing prenatal care.  She is currently monitored for the following issues for this low-risk pregnancy and has History of gestational diabetes and Supervision of normal pregnancy in first trimester on their problem list.  Patient reports occasional contractions.  Contractions: Irregular.  .  Movement: Present. Denies leaking of fluid.   The following portions of the patient's history were reviewed and updated as appropriate: allergies, current medications, past family history, past medical history, past social history, past surgical history and problem list.   Objective:   Vitals:   09/06/22 1317  BP: 123/73  Pulse: 96  Weight: 200 lb (90.7 kg)    Fetal Status: Fetal Heart Rate (bpm): 140 Fundal Height: 34 cm Movement: Present     General:  Alert, oriented and cooperative. Patient is in no acute distress.  Skin: Skin is warm and dry. No rash noted.   Cardiovascular: Normal heart rate noted  Respiratory: Normal respiratory effort, no problems with respiration noted  Abdomen: Soft, gravid, appropriate for gestational age.  Pain/Pressure: Present     Pelvic: Cervical exam deferred        Extremities: Normal range of motion.     Mental Status: Normal mood and affect. Normal behavior. Normal judgment and thought content.   Assessment and Plan:  Pregnancy: [id number] at [redacted]w[redacted]d 1. Encounter for supervision of other normal pregnancy in first trimester Patient is doing well  Urine culture collected Rx maternity support belt provided  Preterm labor symptoms and general obstetric precautions including but not limited to vaginal bleeding, contractions, leaking of fluid and fetal movement were reviewed in detail with the patient. Please refer to After Visit Summary for other counseling recommendations.   Return in about 2 weeks (around 09/20/2022) for in person, ROB, Low risk.  Future  Appointments  Date Time Provider [place]  09/19/2022  1:30 PM Chancy Milroy, MD [facility] None  09/26/2022  1:30 PM Chancy Milroy, MD [place] None  10/03/2022  1:30 PM Gavin Pound, CNM [facility] None  10/10/2022  1:30 PM Chancy Milroy, MD [place] None  10/17/2022  1:30 PM Leftwich-Kirby, Kathie Dike, CNM [facility] None  10/24/2022  1:50 PM Gavin Pound, CNM [facility] None    Mora Bellman, MD

## 2022-09-06 NOTE — Progress Notes (Signed)
Pt states she is having increase in pelvic pressure and pain. Some issues with urination.

## 2022-09-06 NOTE — Addendum Note (Signed)
Addended by: Lewie Loron D on: 09/06/2022 01:57 PM   Modules accepted: Orders

## 2022-09-08 LAB — URINE CULTURE, OB REFLEX

## 2022-09-08 LAB — CULTURE, OB URINE

## 2022-09-13 ENCOUNTER — Encounter (HOSPITAL_COMMUNITY): Payer: Self-pay | Admitting: Obstetrics and Gynecology

## 2022-09-13 ENCOUNTER — Other Ambulatory Visit: Payer: Self-pay

## 2022-09-13 ENCOUNTER — Inpatient Hospital Stay (HOSPITAL_COMMUNITY)
Admission: AD | Admit: 2022-09-13 | Discharge: 2022-09-13 | Disposition: A | Discharge status: Home / Self Care | Payer: Self-pay | Attending: Obstetrics and Gynecology | Admitting: Obstetrics and Gynecology

## 2022-09-13 DIAGNOSIS — M549 Dorsalgia, unspecified: Secondary | ICD-10-CM

## 2022-09-13 DIAGNOSIS — O26899 Other specified pregnancy related conditions, unspecified trimester: Secondary | ICD-10-CM

## 2022-09-13 DIAGNOSIS — M545 Low back pain, unspecified: Secondary | ICD-10-CM | POA: Insufficient documentation

## 2022-09-13 DIAGNOSIS — R102 Pelvic and perineal pain: Secondary | ICD-10-CM

## 2022-09-13 DIAGNOSIS — Z3A34 34 weeks gestation of pregnancy: Secondary | ICD-10-CM | POA: Insufficient documentation

## 2022-09-13 DIAGNOSIS — R1084 Generalized abdominal pain: Secondary | ICD-10-CM | POA: Insufficient documentation

## 2022-09-13 DIAGNOSIS — O26893 Other specified pregnancy related conditions, third trimester: Secondary | ICD-10-CM | POA: Insufficient documentation

## 2022-09-13 DIAGNOSIS — R1013 Epigastric pain: Secondary | ICD-10-CM

## 2022-09-13 DIAGNOSIS — R197 Diarrhea, unspecified: Secondary | ICD-10-CM | POA: Insufficient documentation

## 2022-09-13 DIAGNOSIS — O99891 Other specified diseases and conditions complicating pregnancy: Secondary | ICD-10-CM | POA: Insufficient documentation

## 2022-09-13 DIAGNOSIS — R195 Other fecal abnormalities: Secondary | ICD-10-CM

## 2022-09-13 LAB — URINALYSIS, ROUTINE W REFLEX MICROSCOPIC
Bilirubin Urine: NEGATIVE
Glucose, UA: NEGATIVE mg/dL
Hgb urine dipstick: NEGATIVE
Ketones, ur: NEGATIVE mg/dL
Nitrite: NEGATIVE
Protein, ur: NEGATIVE mg/dL
Specific Gravity, Urine: 1.001 — ABNORMAL LOW (ref 1.005–1.030)
pH: 8 (ref 5.0–8.0)

## 2022-09-13 MED ORDER — CYCLOBENZAPRINE HCL 5 MG PO TABS
5.0000 mg | ORAL_TABLET | Freq: Once | ORAL | Status: AC
  Administered 2022-09-13: 5 mg via ORAL
  Filled 2022-09-13: qty 1

## 2022-09-13 MED ORDER — CYCLOBENZAPRINE HCL 10 MG PO TABS: 10.0000 mg | ORAL_TABLET | Freq: Two times a day (BID) | ORAL | 0 refills | Status: AC | PRN

## 2022-09-13 MED ORDER — LACTATED RINGERS IV SOLN: Freq: Once | INTRAVENOUS | Status: AC

## 2022-09-13 MED ORDER — HYOSCYAMINE SULFATE 0.125 MG SL SUBL
0.1250 mg | SUBLINGUAL_TABLET | Freq: Once | SUBLINGUAL | Status: AC
  Administered 2022-09-13: 0.125 mg via SUBLINGUAL
  Filled 2022-09-13: qty 1

## 2022-09-13 NOTE — MAU Provider Note (Signed)
Chief Complaint:  Back Pain and Contractions   Event Date/Time   First Provider Initiated Contact with Patient 09/13/22 0050     HPI: Eileen Morris is a 26 y.o. [id number] at 21w3dho presents to maternity admissions reporting low back pain, and abdominal cramping   Has had loose stools all day since yesterday  No vomiting. She reports good fetal movement, denies LOF, vaginal bleeding, vaginal itching/burning, urinary symptoms, h/a, dizziness, n/v, constipation or fever/chills..  Back Pain The current episode started today. The problem occurs constantly. The problem is unchanged. The pain is present in the lumbar spine. Stiffness is present All day. Associated symptoms include abdominal pain. Pertinent negatives include no dysuria, fever, numbness or weakness. She has tried analgesics for the symptoms. The treatment provided no relief.  Abdominal Pain This is a new problem. The current episode started today. The problem occurs intermittently. The pain is located in the generalized abdominal region. Associated symptoms include diarrhea, melena and myalgias. Pertinent negatives include no dysuria, fever or nausea. Nothing aggravates the pain. The pain is relieved by Nothing. She has tried acetaminophen for the symptoms. The treatment provided no relief.   RN Note: .FKatheran JamesSLucina Bettyis a 26y.o. at 331w3dere in MAU reporting: ctx and back aching off and on since last night 10/10. Pt called her OB and was told to come in to be evaluate. Pt reports she took Tylenol 50091mith no relief since last night every 6-8 hours. Pt report loose stool since yesterday. Pt denies VB, DFM, LOF, abnormal discharge, recent intercourse, PIH s/s, and complications in the pregnancy.     Past Medical History: Past Medical History:  Diagnosis Date   Gestational diabetes    diet controlled   History of hand surgery     Past obstetric history: OB History  Gravida Para Term Preterm AB Living  _0 SAB  IAB Ectopic Multiple Live Births  2     0 2    # Outcome Date GA Lbr Len/2nd Weight Sex Delivery Anes PTL Lv  5 Current           4 Term 12/15/19 [id number] / 00:28 4110 g M Vag-Spont EPI  LIV  3 Term 05/04/18 40w243w2d7 / 00:56 4165 g M Vag-Spont EPI  LIV  2 SAB 2018          1 SAB 2017            Past Surgical History: Past Surgical History:  Procedure Laterality Date   HAND SURGERY      Family History: Family History  Problem Relation Age of Onset   Hypertension Mother    Cancer Paternal Grandmother    Hypertension Maternal Aunt     Social History: Social History   Tobacco Use   Smoking status: Never   Smokeless tobacco: Never  Vaping Use   Vaping Use: Never used  Substance Use Topics   Alcohol use: No   Drug use: No    Allergies: No Known Allergies  Meds:  Medications Prior to Admission  Medication Sig Dispense Refill Last Dose   Prenatal Vit-Fe Fumarate-FA (PRENATAL MULTIVITAMIN) TABS tablet Take 1 tablet by mouth daily at 12 noon.   09/12/2022 at 2130   acetaminophen (TYLENOL) 500 MG tablet Take 500 mg by mouth every 6 (six) hours as needed for headache.      Blood Pressure Monitoring (BLOOD PRESSURE KIT) DEVI 1 kit by Does not  apply route once a week. 1 each 0    DICLEGIS 10-10 MG TBEC Take 2 tablets by mouth at bedtime. If symptoms persist, add one tablet in the morning and one in the afternoon 100 tablet 5    Elastic Bandages & Supports (COMFORT FIT MATERNITY SUPP MED) MISC Wear daily when ambulating 1 each 0    metroNIDAZOLE (FLAGYL) 500 MG tablet Take 1 tablet (500 mg total) by mouth 2 (two) times daily. 14 tablet 0    terconazole (TERAZOL 3) 0.8 % vaginal cream Place 1 applicator vaginally at bedtime. Apply nightly for three nights. 20 g 0     I have reviewed patient's Past Medical Hx, Surgical Hx, Family Hx, Social Hx, medications and allergies.   ROS:  Review of Systems  Constitutional:  Negative for fever.  Gastrointestinal:  Positive for  abdominal pain, diarrhea and melena. Negative for nausea.  Genitourinary:  Negative for dysuria.  Musculoskeletal:  Positive for back pain and myalgias.  Neurological:  Negative for weakness and numbness.   Other systems negative  Physical Exam  Patient Vitals for the past 24 hrs:  BP Temp Temp src Pulse Resp SpO2 Height Weight  09/13/22 0046 131/71 -- -- 91 -- 98 % -- --  09/13/22 0029 137/71 98 F (36.7 C) Oral 91 18 100 % _0  (1.626 m) 92.1 kg   Constitutional: Well-developed, well-nourished female in no acute distress.  Cardiovascular: normal rate and rhythm Respiratory: normal effort GI: Abd soft, non-tender, gravid appropriate for gestational age.   No rebound or guarding. MS: Extremities nontender, no edema, normal ROM Neurologic: Alert and oriented x 4.  GU: Neg CVAT.  PELVIC EXAM:   Dilation: Fingertip Effacement (%): Thick Station: Ballotable Presentation: Vertex Exam by:: Hansel Feinstein, CNM  FHT:  Baseline 140 , moderate variability, accelerations present, no decelerations Contractions: Occasional  Irregular     Labs: Results for orders placed or performed during the hospital encounter of 09/13/22 (from the past 24 hour(s))  Urinalysis, Routine w reflex microscopic     Status: Abnormal   Collection Time: 09/13/22 12:41 AM  Result Value Ref Range   Color, Urine COLORLESS (A) YELLOW   APPearance CLEAR CLEAR   Specific Gravity, Urine 1.001 (L) 1.005 - 1.030   pH 8.0 5.0 - 8.0   Glucose, UA NEGATIVE NEGATIVE mg/dL   Hgb urine dipstick NEGATIVE NEGATIVE   Bilirubin Urine NEGATIVE NEGATIVE   Ketones, ur NEGATIVE NEGATIVE mg/dL   Protein, ur NEGATIVE NEGATIVE mg/dL   Nitrite NEGATIVE NEGATIVE   Leukocytes,Ua TRACE (A) NEGATIVE   RBC / HPF 0-5 0 - 5 RBC/hpf   WBC, UA 0-5 0 - 5 WBC/hpf   Bacteria, UA RARE (A) NONE SEEN   Squamous Epithelial / LPF 6-10 0 - 5    A/Positive/-- (05/03 1553)  Imaging:  No results found.  MAU Course/MDM: I have reviewed the  triage vital signs and the nursing notes.   Pertinent labs & imaging results that were available during my care of the patient were reviewed by me and considered in my medical decision making (see chart for details).      I have reviewed her medical records including past results, notes and treatments.   I have ordered labs and reviewed results. UA is clear NST reviewed, reactive Treatments in MAU included Levsin, IV fluids, Flexeril   Got good relief of pain in abdomen and back DIscussed signs of preterm labor  Will Rx Flexeril PRN back pain.  Assessment: Single IUP at 82w3dPelvic pressure, no sign of preterm labor Low back pain Loose stools, likely enteritis  Plan: Discharge home Preterm Labor precautions and fetal kick counts Rx Flexeril for back pain Follow up in Office for prenatal visits and recheck Encouraged to return if she develops worsening of symptoms, increase in pain, fever, or other concerning symptoms.   Pt stable at time of discharge.  MHansel FeinsteinCNM, MSN Certified Nurse-Midwife 09/13/2022 12:51 AM

## 2022-09-13 NOTE — MAU Note (Signed)
.  Eileen Morris is a 26 y.o. at [redacted]w[redacted]d here in MAU reporting: ctx and back aching off and on since last night 10/10. Pt called her OB and was told to come in to be evaluate. Pt reports she took Tylenol 500mg  with no relief since last night every 6-8 hours. Pt report loose stool since yesterday. Pt denies VB, DFM, LOF, abnormal discharge, recent intercourse, PIH s/s, and complications in the pregnancy.   Onset of complaint: 09/11/2022 Pain score: 5/10  Vitals:   09/13/22 0029  BP: 137/71  Pulse: 91  Resp: 18  Temp: 98 F (36.7 C)  SpO2: 100%     FHT:140 Lab orders placed from triage:  UA

## 2022-09-18 ENCOUNTER — Encounter: Payer: Self-pay | Admitting: Obstetrics and Gynecology

## 2022-09-19 ENCOUNTER — Encounter: Payer: Self-pay | Admitting: Obstetrics and Gynecology

## 2022-09-19 ENCOUNTER — Ambulatory Visit (INDEPENDENT_AMBULATORY_CARE_PROVIDER_SITE_OTHER): Payer: Self-pay | Admitting: Obstetrics and Gynecology

## 2022-09-19 VITALS — BP 116/70 | HR 98 | Wt 204.0 lb

## 2022-09-19 DIAGNOSIS — Z3A35 35 weeks gestation of pregnancy: Secondary | ICD-10-CM

## 2022-09-19 DIAGNOSIS — Z3481 Encounter for supervision of other normal pregnancy, first trimester: Secondary | ICD-10-CM

## 2022-09-19 NOTE — Progress Notes (Signed)
Subjective:  Eileen Morris is a 26 y.o. [id number] at [redacted]w[redacted]d being seen today for ongoing prenatal care.  She is currently monitored for the following issues for this low-risk pregnancy and has History of gestational diabetes and Supervision of normal pregnancy in first trimester on their problem list.  Patient reports general discomforts of pregnancy.  Contractions: Irregular. Vag. Bleeding: None.  Movement: Present. Denies leaking of fluid.   The following portions of the patient's history were reviewed and updated as appropriate: allergies, current medications, past family history, past medical history, past social history, past surgical history and problem list. Problem list updated.  Objective:   Vitals:   09/19/22 1341  BP: 116/70  Pulse: 98  Weight: 204 lb (92.5 kg)    Fetal Status: Fetal Heart Rate (bpm): 145   Movement: Present     General:  Alert, oriented and cooperative. Patient is in no acute distress.  Skin: Skin is warm and dry. No rash noted.   Cardiovascular: Normal heart rate noted  Respiratory: Normal respiratory effort, no problems with respiration noted  Abdomen: Soft, gravid, appropriate for gestational age. Pain/Pressure: Present     Pelvic:  Cervical exam deferred        Extremities: Normal range of motion.     Mental Status: Normal mood and affect. Normal behavior. Normal judgment and thought content.   Urinalysis:      Assessment and Plan:  Pregnancy: [id number] at [redacted]w[redacted]d Stable GBS next visit There are no diagnoses linked to this encounter. Preterm labor symptoms and general obstetric precautions including but not limited to vaginal bleeding, contractions, leaking of fluid and fetal movement were reviewed in detail with the patient. Please refer to After Visit Summary for other counseling recommendations.  Return in about 1 week (around 09/26/2022) for OB visit, face to face, any provider.   Chancy Milroy, MD

## 2022-09-19 NOTE — Patient Instructions (Signed)

## 2022-09-25 ENCOUNTER — Encounter: Payer: Self-pay | Admitting: Advanced Practice Midwife

## 2022-09-26 ENCOUNTER — Encounter: Payer: Self-pay | Admitting: Obstetrics and Gynecology

## 2022-09-26 ENCOUNTER — Other Ambulatory Visit (HOSPITAL_COMMUNITY)
Admission: RE | Admit: 2022-09-26 | Discharge: 2022-09-26 | Disposition: A | Discharge status: Home / Self Care | Payer: Self-pay | Source: Ambulatory Visit | Attending: Advanced Practice Midwife | Admitting: Advanced Practice Midwife

## 2022-09-26 ENCOUNTER — Ambulatory Visit (INDEPENDENT_AMBULATORY_CARE_PROVIDER_SITE_OTHER): Payer: Self-pay | Admitting: Obstetrics and Gynecology

## 2022-09-26 VITALS — BP 116/75 | HR 103 | Wt 206.0 lb

## 2022-09-26 DIAGNOSIS — Z3483 Encounter for supervision of other normal pregnancy, third trimester: Secondary | ICD-10-CM

## 2022-09-26 DIAGNOSIS — Z8632 Personal history of gestational diabetes: Secondary | ICD-10-CM

## 2022-09-26 DIAGNOSIS — Z3A36 36 weeks gestation of pregnancy: Secondary | ICD-10-CM

## 2022-09-26 DIAGNOSIS — Z3481 Encounter for supervision of other normal pregnancy, first trimester: Secondary | ICD-10-CM

## 2022-09-26 NOTE — Progress Notes (Signed)
Subjective:  Eileen Morris is a 26 y.o. [phone] at [redacted]w[redacted]d being seen today for ongoing prenatal care.  She is currently monitored for the following issues for this low-risk pregnancy and has History of gestational diabetes and Supervision of normal pregnancy in first trimester on their problem list.  Patient reports general discomforts of pregnancy.  Contractions: Not present. Vag. Bleeding: None.  Movement: Present. Denies leaking of fluid.   The following portions of the patient's history were reviewed and updated as appropriate: allergies, current medications, past family history, past medical history, past social history, past surgical history and problem list. Problem list updated.  Objective:   Vitals:   09/26/22 1350  BP: 116/75  Pulse: (!) 103  Weight: 206 lb (93.4 kg)    Fetal Status: Fetal Heart Rate (bpm): 142   Movement: Present     General:  Alert, oriented and cooperative. Patient is in no acute distress.  Skin: Skin is warm and dry. No rash noted.   Cardiovascular: Normal heart rate noted  Respiratory: Normal respiratory effort, no problems with respiration noted  Abdomen: Soft, gravid, appropriate for gestational age. Pain/Pressure: Present     Pelvic:  Cervical exam performed        Extremities: Normal range of motion.  Edema: Trace  Mental Status: Normal mood and affect. Normal behavior. Normal judgment and thought content.   Urinalysis:      Assessment and Plan:  Pregnancy: [id number] at [redacted]w[redacted]d  1. Encounter for supervision of other normal pregnancy in first trimester GBS and vaginal cultures today Labor precautions  2. History of gestational diabetes Nl Glucola  Term labor symptoms and general obstetric precautions including but not limited to vaginal bleeding, contractions, leaking of fluid and fetal movement were reviewed in detail with the patient. Please refer to After Visit Summary for other counseling recommendations.  Return in about 1 week (around  10/03/2022) for OB visit, face to face, any provider.   Chancy Milroy, MD

## 2022-09-26 NOTE — Progress Notes (Signed)
ROB/GBS.  Reports no concerns today.  

## 2022-09-26 NOTE — Patient Instructions (Signed)
Vaginal Delivery  Vaginal delivery means that you give birth by pushing your baby out of your birth canal (vagina). Your health care team will help you before, during, and after vaginal delivery. Birth experiences are unique for every woman and every pregnancy, and birth experiences vary depending on where you choose to give birth. What are the risks and benefits? Generally, this is safe. However, problems may occur, including: Bleeding. Infection. Damage to other structures such as vaginal tearing. Allergic reactions to medicines. Despite the risks, benefits of vaginal delivery include less risk of bleeding and infection and a shorter recovery time compared to a Cesarean delivery. Cesarean delivery, or C-section, is the surgical delivery of a baby. What happens when I arrive at the birth center or hospital? Once you are in labor and have been admitted into the hospital or birth center, your health care team may: Review your pregnancy history and any concerns that you have. Talk with you about your birth plan and discuss pain control options. Check your blood pressure, breathing, and heartbeat. Assess your baby's heartbeat. Monitor your uterus for contractions. Check whether your bag of water (amniotic sac) has broken (ruptured). Insert an IV into one of your veins. This may be used to give you fluids and medicines. Monitoring Your health care team may assess your contractions (uterine monitoring) and your baby's heart rate (fetal monitoring). You may need to be monitored: Often, but not continuously (intermittently). All the time or for long periods at a time (continuously). Continuous monitoring may be needed if: You are taking certain medicines, such as medicine to relieve pain or make your contractions stronger. You have pregnancy or labor complications. Monitoring may be done by: Placing a special stethoscope or a handheld monitoring device on your abdomen to check your baby's  heartbeat and to check for contractions. Placing monitors on your abdomen (external monitors) to record your baby's heartbeat and the frequency and length of contractions. Placing monitors inside your uterus through your vagina (internal monitors) to record your baby's heartbeat and the frequency, length, and strength of your contractions. Depending on the type of monitor, it may remain in your uterus or on your baby's head until birth. Telemetry. This is a type of continuous monitoring that can be done with external or internal monitors. Instead of having to stay in bed, you are able to move around. Physical exam Your health care team may perform frequent physical exams. This may include: Checking how and where your baby is positioned in your uterus. Checking your cervix to determine: Whether it is thinning out (effacing). Whether it is opening up (dilating). What happens during labor and delivery?  Normal labor and delivery is divided into the following three stages: Stage 1 This is the longest stage of labor. Throughout this stage, you will feel contractions. Contractions generally feel mild, infrequent, and irregular at first. They get stronger, more frequent, and more regular as you move through this stage. You may have contractions about every 2-3 minutes. This stage ends when your cervix is completely dilated to 4 inches (10 cm) and completely effaced. Stage 2 This stage starts once your cervix is completely effaced and dilated and lasts until the delivery of your baby. This is the stage where you will feel an urge to push your baby out of your vagina. You may feel stretching and burning pain, especially when the widest part of your baby's head passes through the vaginal opening (crowning). Once your baby is delivered, the umbilical cord will be   clamped and cut. Timing of cutting the cord will depend on your wishes, your baby's health, and your health care provider's practices. Your baby  will be placed on your bare chest (skin-to-skin contact) in an upright position and covered with a warm blanket. If you are choosing to breastfeed, watch your baby for feeding cues, like rooting or sucking, and help the baby to your breast for his or her first feeding. Stage 3 This stage starts immediately after the birth of your baby and ends after you deliver the placenta. This stage may take anywhere from 5 to 30 minutes. After your baby has been delivered, you will feel contractions as your body expels the placenta. These contractions also help your uterus get smaller and reduce bleeding. What can I expect after labor and delivery? After labor is over, you and your baby will be assessed closely until you are ready to go home. Your health care team will teach you how to care for yourself and your baby. You and your baby may be encouraged to stay in the same room (rooming in) during your hospital stay. This will help promote early bonding and successful breastfeeding. Your uterus will be checked and massaged regularly (fundal massage). You may continue to receive fluids and medicines through an IV. You will have some soreness and pain in your abdomen, vagina, and the area of skin between your vaginal opening and your anus (perineum). If an incision was made near your vagina (episiotomy) or if you had some vaginal tearing during delivery, cold compresses may be placed on your episiotomy or your tear. This helps to reduce pain and swelling. It is normal to have vaginal bleeding after delivery. Wear a sanitary pad for vaginal bleeding and discharge. Summary Vaginal delivery means that you will give birth by pushing your baby out of your birth canal (vagina). Your health care team will monitor you and your baby throughout the stages of labor. After you deliver your baby, your health care team will continue to assess you and your baby to ensure you are both recovering as expected after delivery. This  information is not intended to replace advice given to you by your health care provider. Make sure you discuss any questions you have with your health care provider. Document Revised: 10/17/2020 Document Reviewed: 10/17/2020 Elsevier Patient Education  2023 [employer]  

## 2022-09-27 LAB — CERVICOVAGINAL ANCILLARY ONLY
Bacterial Vaginitis (gardnerella): NEGATIVE
Candida Glabrata: NEGATIVE
Candida Vaginitis: POSITIVE — AB
Chlamydia: NEGATIVE
Comment: NEGATIVE
Comment: NEGATIVE
Comment: NEGATIVE
Comment: NEGATIVE
Comment: NEGATIVE
Comment: NORMAL
Neisseria Gonorrhea: NEGATIVE
Trichomonas: NEGATIVE

## 2022-09-28 ENCOUNTER — Telehealth: Payer: Self-pay

## 2022-09-28 MED ORDER — TERCONAZOLE 0.8 % VA CREA: 1.0000 | TOPICAL_CREAM | Freq: Every day | VAGINAL | 0 refills | Status: DC

## 2022-09-28 NOTE — Telephone Encounter (Signed)
S/w pt and advised of results and rx sent. 

## 2022-09-30 LAB — CULTURE, BETA STREP (GROUP B ONLY): Strep Gp B Culture: NEGATIVE

## 2022-10-02 ENCOUNTER — Encounter: Payer: Self-pay | Admitting: Family Medicine

## 2022-10-03 ENCOUNTER — Ambulatory Visit (INDEPENDENT_AMBULATORY_CARE_PROVIDER_SITE_OTHER): Payer: Self-pay

## 2022-10-03 VITALS — BP 127/73 | HR 92 | Wt 206.8 lb

## 2022-10-03 DIAGNOSIS — Z3483 Encounter for supervision of other normal pregnancy, third trimester: Secondary | ICD-10-CM

## 2022-10-03 DIAGNOSIS — Z3A37 37 weeks gestation of pregnancy: Secondary | ICD-10-CM

## 2022-10-03 NOTE — Progress Notes (Signed)
Pt presents for ROB visit. No concerns at this time.  

## 2022-10-03 NOTE — Progress Notes (Signed)
   LOW-RISK PREGNANCY OFFICE VISIT  Patient name: Eileen Morris MRN [id number]  Date of birth: January 29, 1996 Chief Complaint:   Routine Prenatal Visit  Subjective:   Eileen Morris is a 26 y.o. [id number] female at [redacted]w[redacted]d with an Estimated Date of Delivery: 10/22/22 being seen today for ongoing management of a low-risk pregnancy aeb has History of gestational diabetes and Supervision of normal pregnancy in first trimester on their problem list.  Patient presents today, alone, with no complaints, but questions and concerns.  She reports she has started having nose bleeds and questions if this is normal and relief measures.  She also reports some "slight cramping, pain, and pressure."   Patient endorses fetal movement. Patient denies a contractions.  Patient denies vaginal concerns including abnormal discharge, leaking of fluid, and bleeding. Patient reports some constipation and has increased her water intake and has miralax in case needed.  No issues with urination. Patient questions when she will have a cervical exam. Contractions: Not present. Vag. Bleeding: None.  Movement: Present.  Reviewed past medical,surgical, social, obstetrical and family history as well as problem list, medications and allergies.  Objective   Vitals:   10/03/22 1353  BP: 127/73  Pulse: 92  Weight: 206 lb 12.8 oz (93.8 kg)  Body mass index is 35.5 kg/m.  Total Weight Gain:34 lb 12.8 oz (15.8 kg)         Physical Examination:   General appearance: Well appearing, and in no distress  Mental status: Alert, oriented to person, place, and time  Skin: Warm & dry  Cardiovascular: Normal heart rate noted  Respiratory: Normal respiratory effort, no distress  Abdomen: Soft, gravid, nontender, AGA with Fundal Height: 39 cm  Pelvic: Cervical exam deferred           Extremities: Edema: Trace  Fetal Status: Fetal Heart Rate (bpm): 146  Movement: Present   No results found for this or any previous visit (from the  past 24 hour(s)).  Assessment & Plan:  Low-risk pregnancy of a 26 y.o., [id number] at [redacted]w[redacted]d with an Estimated Date of Delivery: 10/22/22   1. Encounter for supervision of other normal pregnancy in third trimester -Anticipatory guidance for upcoming appts. -Patient to schedule next appt in 1 weeks for an in-person visit.   2. [redacted] weeks gestation of pregnancy -Doing well. -Informed that she can be checked at her next visit if desired. -Reassured that vaginal exams are not required or performed at every visit unless requested or indicated.   3. Perinatal epistaxis -Reassured normal and educated on physiological changes that contribute to symptoms. -Discussed usage of humidifier for relief.  -Patient reports she was using but discontinued d/t electrical issues.      Meds: No orders of the defined types were placed in this encounter.  Labs/procedures today:  Lab Orders  No laboratory test(s) ordered today     Reviewed: Term labor symptoms and general obstetric precautions including but not limited to vaginal bleeding, contractions, leaking of fluid and fetal movement were reviewed in detail with the patient.  All questions were answered.  Follow-up: Return in about 1 week (around 10/10/2022) for [place].  No orders of the defined types were placed in this encounter.  Maryann Conners MSN, CNM 10/03/2022

## 2022-10-09 ENCOUNTER — Encounter: Payer: Self-pay | Admitting: Advanced Practice Midwife

## 2022-10-10 ENCOUNTER — Ambulatory Visit (INDEPENDENT_AMBULATORY_CARE_PROVIDER_SITE_OTHER): Payer: Self-pay | Admitting: Obstetrics and Gynecology

## 2022-10-10 ENCOUNTER — Encounter: Payer: Self-pay | Admitting: Obstetrics and Gynecology

## 2022-10-10 VITALS — BP 133/76 | HR 106 | Wt 210.7 lb

## 2022-10-10 DIAGNOSIS — Z3483 Encounter for supervision of other normal pregnancy, third trimester: Secondary | ICD-10-CM

## 2022-10-10 DIAGNOSIS — Z3481 Encounter for supervision of other normal pregnancy, first trimester: Secondary | ICD-10-CM

## 2022-10-10 DIAGNOSIS — Z3A38 38 weeks gestation of pregnancy: Secondary | ICD-10-CM

## 2022-10-10 NOTE — Patient Instructions (Signed)
Vaginal Delivery  Vaginal delivery means that you give birth by pushing your baby out of your birth canal (vagina). Your health care team will help you before, during, and after vaginal delivery. Birth experiences are unique for every woman and every pregnancy, and birth experiences vary depending on where you choose to give birth. What are the risks and benefits? Generally, this is safe. However, problems may occur, including: Bleeding. Infection. Damage to other structures such as vaginal tearing. Allergic reactions to medicines. Despite the risks, benefits of vaginal delivery include less risk of bleeding and infection and a shorter recovery time compared to a Cesarean delivery. Cesarean delivery, or C-section, is the surgical delivery of a baby. What happens when I arrive at the birth center or hospital? Once you are in labor and have been admitted into the hospital or birth center, your health care team may: Review your pregnancy history and any concerns that you have. Talk with you about your birth plan and discuss pain control options. Check your blood pressure, breathing, and heartbeat. Assess your baby's heartbeat. Monitor your uterus for contractions. Check whether your bag of water (amniotic sac) has broken (ruptured). Insert an IV into one of your veins. This may be used to give you fluids and medicines. Monitoring Your health care team may assess your contractions (uterine monitoring) and your baby's heart rate (fetal monitoring). You may need to be monitored: Often, but not continuously (intermittently). All the time or for long periods at a time (continuously). Continuous monitoring may be needed if: You are taking certain medicines, such as medicine to relieve pain or make your contractions stronger. You have pregnancy or labor complications. Monitoring may be done by: Placing a special stethoscope or a handheld monitoring device on your abdomen to check your baby's  heartbeat and to check for contractions. Placing monitors on your abdomen (external monitors) to record your baby's heartbeat and the frequency and length of contractions. Placing monitors inside your uterus through your vagina (internal monitors) to record your baby's heartbeat and the frequency, length, and strength of your contractions. Depending on the type of monitor, it may remain in your uterus or on your baby's head until birth. Telemetry. This is a type of continuous monitoring that can be done with external or internal monitors. Instead of having to stay in bed, you are able to move around. Physical exam Your health care team may perform frequent physical exams. This may include: Checking how and where your baby is positioned in your uterus. Checking your cervix to determine: Whether it is thinning out (effacing). Whether it is opening up (dilating). What happens during labor and delivery?  Normal labor and delivery is divided into the following three stages: Stage 1 This is the longest stage of labor. Throughout this stage, you will feel contractions. Contractions generally feel mild, infrequent, and irregular at first. They get stronger, more frequent, and more regular as you move through this stage. You may have contractions about every 2-3 minutes. This stage ends when your cervix is completely dilated to 4 inches (10 cm) and completely effaced. Stage 2 This stage starts once your cervix is completely effaced and dilated and lasts until the delivery of your baby. This is the stage where you will feel an urge to push your baby out of your vagina. You may feel stretching and burning pain, especially when the widest part of your baby's head passes through the vaginal opening (crowning). Once your baby is delivered, the umbilical cord will be   clamped and cut. Timing of cutting the cord will depend on your wishes, your baby's health, and your health care provider's practices. Your baby  will be placed on your bare chest (skin-to-skin contact) in an upright position and covered with a warm blanket. If you are choosing to breastfeed, watch your baby for feeding cues, like rooting or sucking, and help the baby to your breast for his or her first feeding. Stage 3 This stage starts immediately after the birth of your baby and ends after you deliver the placenta. This stage may take anywhere from 5 to 30 minutes. After your baby has been delivered, you will feel contractions as your body expels the placenta. These contractions also help your uterus get smaller and reduce bleeding. What can I expect after labor and delivery? After labor is over, you and your baby will be assessed closely until you are ready to go home. Your health care team will teach you how to care for yourself and your baby. You and your baby may be encouraged to stay in the same room (rooming in) during your hospital stay. This will help promote early bonding and successful breastfeeding. Your uterus will be checked and massaged regularly (fundal massage). You may continue to receive fluids and medicines through an IV. You will have some soreness and pain in your abdomen, vagina, and the area of skin between your vaginal opening and your anus (perineum). If an incision was made near your vagina (episiotomy) or if you had some vaginal tearing during delivery, cold compresses may be placed on your episiotomy or your tear. This helps to reduce pain and swelling. It is normal to have vaginal bleeding after delivery. Wear a sanitary pad for vaginal bleeding and discharge. Summary Vaginal delivery means that you will give birth by pushing your baby out of your birth canal (vagina). Your health care team will monitor you and your baby throughout the stages of labor. After you deliver your baby, your health care team will continue to assess you and your baby to ensure you are both recovering as expected after delivery. This  information is not intended to replace advice given to you by your health care provider. Make sure you discuss any questions you have with your health care provider. Document Revised: 10/17/2020 Document Reviewed: 10/17/2020 Elsevier Patient Education  2023 [employer]  

## 2022-10-10 NOTE — Progress Notes (Signed)
Subjective:  Eileen Morris is a 26 y.o. [id number] at [redacted]w[redacted]d being seen today for ongoing prenatal care.  She is currently monitored for the following issues for this low-risk pregnancy and has History of gestational diabetes and Supervision of normal pregnancy in first trimester on their problem list.  Patient reports  general discomforts of pregnancy .  Contractions: Not present. Vag. Bleeding: None.  Movement: Present. Denies leaking of fluid.   The following portions of the patient's history were reviewed and updated as appropriate: allergies, current medications, past family history, past medical history, past social history, past surgical history and problem list. Problem list updated.  Objective:   Vitals:   10/10/22 1336  BP: 133/76  Pulse: (!) 106  Weight: 210 lb 11.2 oz (95.6 kg)    Fetal Status: Fetal Heart Rate (bpm): 133   Movement: Present     General:  Alert, oriented and cooperative. Patient is in no acute distress.  Skin: Skin is warm and dry. No rash noted.   Cardiovascular: Normal heart rate noted  Respiratory: Normal respiratory effort, no problems with respiration noted  Abdomen: Soft, gravid, appropriate for gestational age. Pain/Pressure: Present     Pelvic:  Cervical exam deferred        Extremities: Normal range of motion.  Edema: Trace  Mental Status: Normal mood and affect. Normal behavior. Normal judgment and thought content.   Urinalysis:      Assessment and Plan:  Pregnancy: [id number] at [redacted]w[redacted]d  1. Encounter for supervision of other normal pregnancy in first trimester Labor precautions  Term labor symptoms and general obstetric precautions including but not limited to vaginal bleeding, contractions, leaking of fluid and fetal movement were reviewed in detail with the patient. Please refer to After Visit Summary for other counseling recommendations.  Return in about 1 week (around 10/17/2022) for OB visit, face to face, any provider.   Hermina Staggers, MD

## 2022-10-16 ENCOUNTER — Encounter: Payer: Self-pay | Admitting: Certified Nurse Midwife

## 2022-10-17 ENCOUNTER — Encounter: Payer: Self-pay | Admitting: Advanced Practice Midwife

## 2022-10-17 ENCOUNTER — Ambulatory Visit (INDEPENDENT_AMBULATORY_CARE_PROVIDER_SITE_OTHER): Payer: Self-pay | Admitting: Advanced Practice Midwife

## 2022-10-17 ENCOUNTER — Other Ambulatory Visit: Payer: Self-pay | Admitting: Advanced Practice Midwife

## 2022-10-17 VITALS — BP 124/76 | HR 102 | Wt 212.3 lb

## 2022-10-17 DIAGNOSIS — Z3481 Encounter for supervision of other normal pregnancy, first trimester: Secondary | ICD-10-CM

## 2022-10-17 DIAGNOSIS — Z8632 Personal history of gestational diabetes: Secondary | ICD-10-CM

## 2022-10-17 DIAGNOSIS — Z3483 Encounter for supervision of other normal pregnancy, third trimester: Secondary | ICD-10-CM

## 2022-10-17 DIAGNOSIS — Z3A39 39 weeks gestation of pregnancy: Secondary | ICD-10-CM

## 2022-10-17 NOTE — Progress Notes (Signed)
Pt presents for ROB visit. No concerns at this time.  

## 2022-10-17 NOTE — Patient Instructions (Signed)
Things to Try After 37 weeks to Encourage Labor/Get Ready for Labor:    Try the [employer] Circuit at www.milescircuit.com daily to improve baby's position and encourage the onset of labor.  Walk a little and rest a little every day.  Change positions often.  Cervical Ripening: May try one or both Red Raspberry Leaf capsules or tea:  two 300mg or 400mg tablets with each meal, 2-3 times a day, or 1-3 cups of tea daily  Potential Side Effects Of Raspberry Leaf:  Most women do not experience any side effects from drinking raspberry leaf tea. However, nausea and loose stools are possible   Evening Primrose Oil capsules: take 1 capsule by mouth and place one capsule in the vagina every night.    Some of the potential side effects:  Upset stomach  Loose stools or diarrhea  Headaches  Nausea  Sex can also help the cervix ripen and encourage labor onset.    Labor Precautions Reasons to come to MAU at [facility]:  1.  Contractions are  5 minutes apart or less, each last 1 minute, these have been going on for 1-2 hours, and you cannot walk or talk during them 2.  You have a large gush of fluid, or a trickle of fluid that will not stop and you have to wear a pad 3.  You have bleeding that is bright red, heavier than spotting--like menstrual bleeding (spotting can be normal in early labor or after a check of your cervix) 4.  You do not feel the baby moving like he/she normally does  

## 2022-10-17 NOTE — Progress Notes (Signed)
   PRENATAL VISIT NOTE  Subjective:  Eileen Morris is a 26 y.o. [phone] at [redacted]w[redacted]d being seen today for ongoing prenatal care.  She is currently monitored for the following issues for this low-risk pregnancy and has History of gestational diabetes and Supervision of normal pregnancy in first trimester on their problem list.  Patient reports occasional contractions.  Contractions: Not present. Vag. Bleeding: None.  Movement: Present. Denies leaking of fluid.   The following portions of the patient's history were reviewed and updated as appropriate: allergies, current medications, past family history, past medical history, past social history, past surgical history and problem list.   Objective:   Vitals:   10/17/22 1345  BP: 124/76  Pulse: (!) 102  Weight: 212 lb 4.8 oz (96.3 kg)    Fetal Status: Fetal Heart Rate (bpm): 142 Fundal Height: 39 cm Movement: Present     General:  Alert, oriented and cooperative. Patient is in no acute distress.  Skin: Skin is warm and dry. No rash noted.   Cardiovascular: Normal heart rate noted  Respiratory: Normal respiratory effort, no problems with respiration noted  Abdomen: Soft, gravid, appropriate for gestational age.  Pain/Pressure: Present     Pelvic: Cervical exam performed in the presence of a chaperone Dilation: 4 Effacement (%): 50 Station: -2  Extremities: Normal range of motion.  Edema: Trace  Mental Status: Normal mood and affect. Normal behavior. Normal judgment and thought content.   Assessment and Plan:  Pregnancy: [id number] at [redacted]w[redacted]d 1. Encounter for supervision of other normal pregnancy in first trimester --Anticipatory guidance about next visits/weeks of pregnancy given.  --Pt desires elective IOL, was discussed at previous visits, very favorable cervix on exam today, membranes swept, pt to try [employer] at home today and daily --Orders placed and request for IOL this week   2. History of gestational diabetes --Normal A1C  and 28 week testing this pregnancy  3. [redacted] weeks gestation of pregnancy   Term labor symptoms and general obstetric precautions including but not limited to vaginal bleeding, contractions, leaking of fluid and fetal movement were reviewed in detail with the patient. Please refer to After Visit Summary for other counseling recommendations.   Return in about 1 week (around 10/24/2022) for LOB.  Future Appointments  Date Time Provider Department Center  10/24/2022  1:50 PM Gerrit Heck, CNM [facility] None    Sharen Counter, CNM

## 2022-10-19 ENCOUNTER — Telehealth (HOSPITAL_COMMUNITY): Payer: Self-pay | Admitting: *Deleted

## 2022-10-19 NOTE — Telephone Encounter (Signed)
Preadmission screen  

## 2022-10-20 ENCOUNTER — Inpatient Hospital Stay (HOSPITAL_COMMUNITY): Admission: AD | Admit: 2022-10-20 | Payer: Self-pay | Source: Home / Self Care | Admitting: Family Medicine

## 2022-10-20 ENCOUNTER — Encounter (HOSPITAL_COMMUNITY): Payer: Self-pay | Admitting: Obstetrics and Gynecology

## 2022-10-20 ENCOUNTER — Inpatient Hospital Stay (HOSPITAL_COMMUNITY): Payer: Self-pay | Attending: Family Medicine

## 2022-10-20 ENCOUNTER — Inpatient Hospital Stay (HOSPITAL_COMMUNITY)
Admission: AD | Admit: 2022-10-20 | Discharge: 2022-10-21 | DRG: 807 | Disposition: A | Discharge status: Home / Self Care | Payer: Self-pay | Attending: Obstetrics & Gynecology | Admitting: Obstetrics & Gynecology

## 2022-10-20 ENCOUNTER — Other Ambulatory Visit: Payer: Self-pay

## 2022-10-20 ENCOUNTER — Inpatient Hospital Stay (HOSPITAL_COMMUNITY): Payer: Self-pay | Admitting: Anesthesiology

## 2022-10-20 DIAGNOSIS — Z23 Encounter for immunization: Secondary | ICD-10-CM

## 2022-10-20 DIAGNOSIS — Z8632 Personal history of gestational diabetes: Secondary | ICD-10-CM | POA: Diagnosis present

## 2022-10-20 DIAGNOSIS — Z3481 Encounter for supervision of other normal pregnancy, first trimester: Secondary | ICD-10-CM

## 2022-10-20 DIAGNOSIS — Z3A39 39 weeks gestation of pregnancy: Secondary | ICD-10-CM

## 2022-10-20 DIAGNOSIS — O4202 Full-term premature rupture of membranes, onset of labor within 24 hours of rupture: Secondary | ICD-10-CM

## 2022-10-20 LAB — TYPE AND SCREEN
ABO/RH(D): A POS
Antibody Screen: NEGATIVE

## 2022-10-20 LAB — CBC
HCT: 35.9 % — ABNORMAL LOW (ref 36.0–46.0)
Hemoglobin: 12.1 g/dL (ref 12.0–15.0)
MCH: 31 pg (ref 26.0–34.0)
MCHC: 33.7 g/dL (ref 30.0–36.0)
MCV: 92.1 fL (ref 80.0–100.0)
Platelets: 209 10*3/uL (ref 150–400)
RBC: 3.9 MIL/uL (ref 3.87–5.11)
RDW: 14.3 % (ref 11.5–15.5)
WBC: 9.7 10*3/uL (ref 4.0–10.5)
nRBC: 0 % (ref 0.0–0.2)

## 2022-10-20 LAB — RPR: RPR Ser Ql: NONREACTIVE

## 2022-10-20 MED ORDER — PHENYLEPHRINE 80 MCG/ML (10ML) SYRINGE FOR IV PUSH (FOR BLOOD PRESSURE SUPPORT): 80.0000 ug | PREFILLED_SYRINGE | INTRAVENOUS | Status: DC | PRN

## 2022-10-20 MED ORDER — OXYCODONE-ACETAMINOPHEN 5-325 MG PO TABS: 2.0000 | ORAL_TABLET | ORAL | Status: DC | PRN

## 2022-10-20 MED ORDER — VITAMIN K1 1 MG/0.5ML IJ SOLN
INTRAMUSCULAR | Status: AC
  Filled 2022-10-20: qty 0.5

## 2022-10-20 MED ORDER — LIDOCAINE HCL (PF) 1 % IJ SOLN: 30.0000 mL | INTRAMUSCULAR | Status: DC | PRN

## 2022-10-20 MED ORDER — EPHEDRINE 5 MG/ML INJ: 10.0000 mg | INTRAVENOUS | Status: DC | PRN

## 2022-10-20 MED ORDER — WITCH HAZEL-GLYCERIN EX PADS: 1.0000 | MEDICATED_PAD | CUTANEOUS | Status: DC | PRN

## 2022-10-20 MED ORDER — BENZOCAINE-MENTHOL 20-0.5 % EX AERO
1.0000 | INHALATION_SPRAY | CUTANEOUS | Status: DC | PRN
  Administered 2022-10-20: 1 via TOPICAL
  Filled 2022-10-20: qty 56

## 2022-10-20 MED ORDER — TETANUS-DIPHTH-ACELL PERTUSSIS 5-2.5-18.5 LF-MCG/0.5 IM SUSY
0.5000 mL | PREFILLED_SYRINGE | Freq: Once | INTRAMUSCULAR | Status: AC
  Administered 2022-10-21: 0.5 mL via INTRAMUSCULAR
  Filled 2022-10-20: qty 0.5

## 2022-10-20 MED ORDER — ACETAMINOPHEN 325 MG PO TABS
650.0000 mg | ORAL_TABLET | ORAL | Status: DC | PRN
  Administered 2022-10-21 (×4): 650 mg via ORAL
  Filled 2022-10-20 (×4): qty 2

## 2022-10-20 MED ORDER — LACTATED RINGERS IV SOLN: 500.0000 mL | INTRAVENOUS | Status: DC | PRN

## 2022-10-20 MED ORDER — COCONUT OIL OIL: 1.0000 | TOPICAL_OIL | Status: DC | PRN

## 2022-10-20 MED ORDER — PRENATAL MULTIVITAMIN CH
1.0000 | ORAL_TABLET | Freq: Every day | ORAL | Status: DC
  Administered 2022-10-21: 1 via ORAL
  Filled 2022-10-20: qty 1

## 2022-10-20 MED ORDER — SOD CITRATE-CITRIC ACID 500-334 MG/5ML PO SOLN: 30.0000 mL | ORAL | Status: DC | PRN

## 2022-10-20 MED ORDER — OXYTOCIN-SODIUM CHLORIDE 30-0.9 UT/500ML-% IV SOLN
1.0000 m[IU]/min | INTRAVENOUS | Status: DC
  Filled 2022-10-20: qty 500

## 2022-10-20 MED ORDER — OXYTOCIN-SODIUM CHLORIDE 30-0.9 UT/500ML-% IV SOLN: 2.5000 [IU]/h | INTRAVENOUS | Status: DC

## 2022-10-20 MED ORDER — FENTANYL-BUPIVACAINE-NACL 0.5-0.125-0.9 MG/250ML-% EP SOLN
12.0000 mL/h | EPIDURAL | Status: DC | PRN
  Administered 2022-10-20: 12 mL/h via EPIDURAL
  Filled 2022-10-20: qty 250

## 2022-10-20 MED ORDER — LACTATED RINGERS IV SOLN: INTRAVENOUS | Status: DC

## 2022-10-20 MED ORDER — ONDANSETRON HCL 4 MG/2ML IJ SOLN: 4.0000 mg | INTRAMUSCULAR | Status: DC | PRN

## 2022-10-20 MED ORDER — ONDANSETRON HCL 4 MG/2ML IJ SOLN: 4.0000 mg | Freq: Four times a day (QID) | INTRAMUSCULAR | Status: DC | PRN

## 2022-10-20 MED ORDER — IBUPROFEN 600 MG PO TABS
600.0000 mg | ORAL_TABLET | Freq: Four times a day (QID) | ORAL | Status: DC
  Administered 2022-10-20 – 2022-10-21 (×4): 600 mg via ORAL
  Filled 2022-10-20 (×5): qty 1

## 2022-10-20 MED ORDER — OXYCODONE-ACETAMINOPHEN 5-325 MG PO TABS: 1.0000 | ORAL_TABLET | ORAL | Status: DC | PRN

## 2022-10-20 MED ORDER — TERBUTALINE SULFATE 1 MG/ML IJ SOLN: 0.2500 mg | Freq: Once | INTRAMUSCULAR | Status: DC | PRN

## 2022-10-20 MED ORDER — DIPHENHYDRAMINE HCL 25 MG PO CAPS: 25.0000 mg | ORAL_CAPSULE | Freq: Four times a day (QID) | ORAL | Status: DC | PRN

## 2022-10-20 MED ORDER — SENNOSIDES-DOCUSATE SODIUM 8.6-50 MG PO TABS
2.0000 | ORAL_TABLET | Freq: Every day | ORAL | Status: DC
  Administered 2022-10-21: 2 via ORAL
  Filled 2022-10-20: qty 2

## 2022-10-20 MED ORDER — SIMETHICONE 80 MG PO CHEW: 80.0000 mg | CHEWABLE_TABLET | ORAL | Status: DC | PRN

## 2022-10-20 MED ORDER — LACTATED RINGERS IV SOLN: 500.0000 mL | Freq: Once | INTRAVENOUS | Status: DC

## 2022-10-20 MED ORDER — OXYTOCIN BOLUS FROM INFUSION
333.0000 mL | Freq: Once | INTRAVENOUS | Status: AC
  Administered 2022-10-20: 333 mL via INTRAVENOUS

## 2022-10-20 MED ORDER — DIBUCAINE (PERIANAL) 1 % EX OINT: 1.0000 | TOPICAL_OINTMENT | CUTANEOUS | Status: DC | PRN

## 2022-10-20 MED ORDER — DIPHENHYDRAMINE HCL 50 MG/ML IJ SOLN: 12.5000 mg | INTRAMUSCULAR | Status: DC | PRN

## 2022-10-20 MED ORDER — ONDANSETRON HCL 4 MG PO TABS: 4.0000 mg | ORAL_TABLET | ORAL | Status: DC | PRN

## 2022-10-20 MED ORDER — ACETAMINOPHEN 325 MG PO TABS: 650.0000 mg | ORAL_TABLET | ORAL | Status: DC | PRN

## 2022-10-20 MED ORDER — LIDOCAINE HCL (PF) 1 % IJ SOLN
INTRAMUSCULAR | Status: DC | PRN
  Administered 2022-10-20: 10 mL via EPIDURAL

## 2022-10-20 MED ORDER — OXYTOCIN-SODIUM CHLORIDE 30-0.9 UT/500ML-% IV SOLN
1.0000 m[IU]/min | INTRAVENOUS | Status: DC
  Administered 2022-10-20: 2 m[IU]/min via INTRAVENOUS

## 2022-10-20 NOTE — Anesthesia Preprocedure Evaluation (Signed)
Anesthesia Evaluation  Patient identified by MRN, date of birth, ID band Patient awake    Reviewed: Allergy & Precautions, NPO status , Patient's Chart, lab work & pertinent test results  Airway Mallampati: II       Dental no notable dental hx.    Pulmonary neg pulmonary ROS   Pulmonary exam normal        Cardiovascular Normal cardiovascular exam     Neuro/Psych negative neurological ROS  negative psych ROS   GI/Hepatic negative GI ROS, Neg liver ROS,,,  Endo/Other  negative endocrine ROSdiabetes, Gestational    Renal/GU negative Renal ROS  negative genitourinary   Musculoskeletal negative musculoskeletal ROS (+)    Abdominal  (+) + obese  Peds  Hematology negative hematology ROS (+)   Anesthesia Other Findings   Reproductive/Obstetrics (+) Pregnancy                             Anesthesia Physical Anesthesia Plan  ASA: 2  Anesthesia Plan: Epidural   Post-op Pain Management:    Induction:   PONV Risk Score and Plan:   Airway Management Planned:   Additional Equipment:   Intra-op Plan:   Post-operative Plan:   Informed Consent: I have reviewed the patients History and Physical, chart, labs and discussed the procedure including the risks, benefits and alternatives for the proposed anesthesia with the patient or authorized representative who has indicated his/her understanding and acceptance.       Plan Discussed with:   Anesthesia Plan Comments:         Anesthesia Quick Evaluation

## 2022-10-20 NOTE — MAU Note (Signed)
.  Eileen Morris is a 26 y.o. at [redacted]w[redacted]d here in MAU reporting: SROM clear fluid around 0230 and CTX shortly after. Pt denies VB, DFM recent intercourse, abnornal discharge, and complications in the pregnancy. SVE 4/50/-2 GBS neg Onset of complaint: 0230 Pain score: 6/10 There were no vitals filed for this visit.   FHT:145 Lab orders placed from triage:

## 2022-10-20 NOTE — Discharge Summary (Shared)
Postpartum Discharge Summary  Date of Service updated***     Patient Name: Eileen Morris DOB: 12-Feb-1996 MRN: [id number]  Date of admission: 10/20/2022 Delivery date:10/20/2022  Delivering provider: Krystal Eaton B  Date of discharge: 10/20/2022  Admitting diagnosis: [redacted] weeks gestation of pregnancy [Z3A.39] Intrauterine pregnancy: [redacted]w[redacted]d    Secondary diagnosis:  Principal Problem:   356weeks gestation of pregnancy Active Problems:   History of gestational diabetes   SVD (spontaneous vaginal delivery)  Additional problems: ***    Discharge diagnosis: Term Pregnancy Delivered                                              Post partum procedures:{Postpartum procedures:23558} Augmentation:  none Complications: None  Hospital course: Onset of Labor With Vaginal Delivery      26y.o. yo [id number] [id number] admitted in Active Labor and SROM on 10/20/2022. Labor course was uncomplicated  Membrane Rupture Time/Date: 2:00 AM ,10/20/2022   Delivery Method:Vaginal, Spontaneous  Episiotomy: None  Lacerations:  1st degree;Perineal  Patient had a postpartum course complicated by ***.  She is ambulating, tolerating a regular diet, passing flatus, and urinating well. Patient is discharged home in stable condition on 10/20/22.  Newborn Data: Birth date:10/20/2022  Birth time:12:14 PM  Gender:Female  Living status:Living  Apgars:8 ,9  Weight:   Magnesium Sulfate received: No BMZ received: No Rhophylac:N/A MMR:N/A, Rubella Immune T-DaP: declined during prenatal care Flu: Received prenatally Transfusion:{Transfusion received:30440034}  Physical exam  Vitals:   10/20/22 1201 10/20/22 1230 10/20/22 1234 10/20/22 1246  BP: (!) 106/56 109/60 121/66 128/69  Pulse: 89 89 88 90  Resp:      Temp:   98 F (36.7 C)   TempSrc:   Oral   SpO2:      Weight:      Height:       General: {Exam; general:21111117} Lochia: {Desc;  appropriate/inappropriate:30686::"appropriate"} Uterine Fundus: {Desc; firm/soft:30687} Incision: {Exam; incision:21111123} DVT Evaluation: {Exam; dvt:2111122} Labs: Lab Results  Component Value Date   WBC 9.7 10/20/2022   HGB 12.1 10/20/2022   HCT 35.9 (L) 10/20/2022   MCV 92.1 10/20/2022   PLT 209 10/20/2022       No data to display         Edinburgh Score:    01/12/2020    1:33 PM  Edinburgh Postnatal Depression Scale Screening Tool  I have been able to laugh and see the funny side of things. 0  I have looked forward with enjoyment to things. 0  I have blamed myself unnecessarily when things went wrong. 0  I have been anxious or worried for no good reason. 0  I have felt scared or panicky for no good reason. 0  Things have been getting on top of me. 0  I have been so unhappy that I have had difficulty sleeping. 0  I have felt sad or miserable. 0  I have been so unhappy that I have been crying. 0  The thought of harming myself has occurred to me. 0  Edinburgh Postnatal Depression Scale Total 0     After visit meds:  Allergies as of 10/20/2022   No Known Allergies   Med Rec must be completed prior to using this [facility]        Discharge home in stable condition Infant Feeding: {Baby feeding:23562} Infant  Disposition:{CHL IP OB HOME WITH NWGNFA:21308} Discharge instruction: per After Visit Summary and Postpartum booklet. Activity: Advance as tolerated. Pelvic rest for 6 weeks.  Diet: {OB MVHQ:46962952} Future Appointments: Future Appointments  Date Time Provider [place]  10/24/2022  1:50 PM Gavin Pound, CNM [facility] None   Follow up Visit:  The following message was sent to [facility] by Mikki Santee, MD  Please schedule this patient for a Virtual postpartum visit in 6 weeks with the following provider: Any provider. Additional Postpartum F/U: none   Low risk pregnancy complicated by:  none Delivery mode:  Vaginal, Spontaneous   Anticipated Birth Control:  POPs   10/20/2022 Chiagoziem Sherrilyn Rist, MD

## 2022-10-20 NOTE — Anesthesia Procedure Notes (Signed)
Epidural Patient location during procedure: OB Start time: 10/20/2022 6:43 AM End time: 10/20/2022 6:47 AM  Staffing Anesthesiologist: Leilani Able, MD Performed: anesthesiologist   Preanesthetic Checklist Completed: patient identified, IV checked, site marked, risks and benefits discussed, surgical consent, monitors and equipment checked, pre-op evaluation and timeout performed  Epidural Patient position: sitting Prep: DuraPrep and site prepped and draped Patient monitoring: continuous pulse ox and blood pressure Approach: midline Location: L3-L4 Injection technique: LOR air  Needle:  Needle type: Tuohy  Needle gauge: 17 G Needle length: 9 cm and 9 Needle insertion depth: 6 cm Catheter type: closed end flexible Catheter size: 19 Gauge Catheter at skin depth: 11 cm Test dose: negative and Other  Assessment Events: blood not aspirated, injection not painful, no injection resistance, paresthesia and negative IV test  Additional Notes R leg X 1 on initial entryReason for block:procedure for pain

## 2022-10-20 NOTE — H&P (Cosign Needed Addendum)
OBSTETRIC ADMISSION HISTORY AND PHYSICAL  Eileen Morris is a 26 y.o. female [phone] with IUP at 1w5dby LMP scheduled initially for eIOL today but presented to MAU with SROM and contractions at 0200 today. Reports clear fluid. Feeling ctx every 5 min. She reports +FMs, no VB, no blurry vision, headaches or peripheral edema, and RUQ pain.  She plans on Breast feeding. She request POPs for birth control. [facility] --->  Estimated Date of Delivery: 10/22/22  Sono:    _0 , CWD, normal anatomy, anterior placenta, 286g, 73% EFW   Prenatal History/Complications:  -History of GDMA in previous pregnancy, sugars this pregnancy wnl     Past Medical History: Past Medical History:  Diagnosis Date   Gestational diabetes    diet controlled   History of hand surgery    Past Surgical History: Past Surgical History:  Procedure Laterality Date   HAND SURGERY     Obstetrical History: OB History     Gravida  5   Para  2   Term  2   Preterm      AB  2   Living  2      SAB  2   IAB      Ectopic      Multiple  0   Live Births  2          Social History Social History   Socioeconomic History   Marital status: Married    Spouse name: Not on file   Number of children: Not on file   Years of education: Not on file   Highest education level: Not on file  Occupational History   Not on file  Tobacco Use   Smoking status: Never   Smokeless tobacco: Never  Vaping Use   Vaping Use: Never used  Substance and Sexual Activity   Alcohol use: No   Drug use: No   Sexual activity: Yes    Partners: Male    Birth control/protection: None  Other Topics Concern   Not on file  Social History Narrative   Not on file   Social Determinants of Health   Financial Resource Strain: Not on file  Food Insecurity: No Food Insecurity (10/20/2022)   Hunger Vital Sign    Worried About Running Out of Food in the Last Year:  Never true    Ran Out of Food in the Last Year: Never true  Transportation Needs: No Transportation Needs (10/20/2022)   PRAPARE - THydrologist(Medical): No    Lack of Transportation (Non-Medical): No  Physical Activity: Not on file  Stress: Not on file  Social Connections: Not on file   Family History: Family History  Problem Relation Age of Onset   Hypertension Mother    Cancer Paternal Grandmother    Hypertension Maternal Aunt    Allergies: No Known Allergies  Medications Prior to Admission  Medication Sig Dispense Refill Last Dose   Prenatal Vit-Fe Fumarate-FA (PRENATAL MULTIVITAMIN) TABS tablet Take 1 tablet by mouth daily at 12 noon.   10/19/2022   acetaminophen (TYLENOL) 500 MG tablet Take 500 mg by mouth every 6 (six) hours as needed for headache.      Blood Pressure Monitoring (BLOOD PRESSURE KIT) DEVI 1 kit by Does not apply route once a week. 1 each 0    cyclobenzaprine (FLEXERIL) 10 MG tablet Take 1 tablet (10 mg total)  by mouth 2 (two) times daily as needed for muscle spasms. 20 tablet 0    DICLEGIS 10-10 MG TBEC Take 2 tablets by mouth at bedtime. If symptoms persist, add one tablet in the morning and one in the afternoon 100 tablet 5    Elastic Bandages & Supports (COMFORT FIT MATERNITY SUPP MED) MISC Wear daily when ambulating 1 each 0    terconazole (TERAZOL 3) 0.8 % vaginal cream Place 1 applicator vaginally at bedtime. 20 g 0    Review of Systems   All systems reviewed and negative except as stated in HPI  Blood pressure 122/75, pulse 86, temperature 97.9 F (36.6 C), temperature source Oral, resp. rate 17, height _0  (1.626 m), weight 96 kg, last menstrual period 01/15/2022, SpO2 100 %, currently breastfeeding. General appearance: alert, cooperative, and mild distress Lungs: clear to auscultation bilaterally Heart: regular rate and rhythm Abdomen: soft, non-tender; bowel sounds normal Pelvic: adequate Extremities: Homans sign  is negative, no sign of DVT Presentation: cephalic Fetal monitoringBaseline: 145 bpm, Variability: moderate, Accelerations: present, and Decelerations: Absent Uterine activity: q 4-28mn  Dilation: 6 Effacement (%): 50 Station: -2 Exam by:: PFerdinand Lango MD  Prenatal labs: ABO, Rh: --/--/A POS (11/18 09242 Antibody: PENDING (11/18 0506) Rubella: 1.52 (05/03 1553) RPR: Non Reactive (08/22 0915)  HBsAg: Negative (05/03 1553)  HIV: Non Reactive (08/22 0915)  GBS: Negative/-- (10/25 1418)  1 hr Glucola normal Genetic screening  negative Anatomy UKoreanormal  Prenatal Transfer Tool  Maternal Diabetes: No Genetic Screening: Normal Maternal Ultrasounds/Referrals: Normal Fetal Ultrasounds or other Referrals:  None Maternal Substance Abuse:  No Significant Maternal Medications:  None Significant Maternal Lab Results:  Group B Strep negative Number of Prenatal Visits:greater than 3 verified prenatal visits Other Comments:  None  Results for orders placed or performed during the hospital encounter of 10/20/22 (from the past 24 hour(s))  CBC   Collection Time: 10/20/22  5:05 AM  Result Value Ref Range   WBC 9.7 4.0 - 10.5 K/uL   RBC 3.90 3.87 - 5.11 MIL/uL   Hemoglobin 12.1 12.0 - 15.0 g/dL   HCT 35.9 (L) 36.0 - 46.0 %   MCV 92.1 80.0 - 100.0 fL   MCH 31.0 26.0 - 34.0 pg   MCHC 33.7 30.0 - 36.0 g/dL   RDW 14.3 11.5 - 15.5 %   Platelets 209 150 - 400 K/uL   nRBC 0.0 0.0 - 0.2 %  Type and screen   Collection Time: 10/20/22  5:06 AM  Result Value Ref Range   ABO/RH(D) A POS    Antibody Screen PENDING    Sample Expiration      10/23/2022,2359 [facility]formed at [facility] [address] [place] [address]    Patient Active Problem List   Diagnosis Date Noted   [redacted] weeks gestation of pregnancy 10/20/2022   Supervision of normal pregnancy in first trimester 03/21/2022   History of gestational diabetes 12/15/2019    Assessment/Plan:  Eileen MustardDel SGianah Morris a 26y.o.  [id number] 366w5dere for SOL.  #Labor: SVE on admit 5cm. Changed to 6cm after 2 hours. Plan for low dose pit for augmentation.  #Pain: Epidural #FWB: Cat I #ID:  GBS neg #MOF: Breast #MOC: POPs #Circ:  N/A female   DaRebecca EatonMD  PGY3 Family Medicine Resident  MACando1/18/2023, 6:06 AM  Fellow Attestation  I saw and evaluated the patient, performing the key elements of the service.I  personally  performed or re-performed the history, physical exam, and medical decision making activities of this service and have verified that the service and findings are accurately documented in the resident's note. I developed the management plan that is described in the resident's note, and I agree with the content, with my edits above.    Gifford Shave, MD OB Fellow

## 2022-10-20 NOTE — Lactation Note (Signed)
This note was copied from a baby's chart. Lactation Consultation Note  Patient Name: Eileen Morris [id number] Date: 10/20/2022 Reason for consult: Initial assessment;Term Age:26 hours Birth Parent feeding choice is breast and formula feeding. Birth Parent had prior BF experience see materna data below. Prior to [facility] entering room Birth Parent was going give infant formula because she thought she didn't have any colostrum or wasn't producing milk. Birth Parent  decided to latch infant instead of formula feeding infant , Birth Parent latched infant on her left breast using the football hold position, infant latched with depth and BF for 14 minutes and afterwards Birth Parent self expressed 4 mls of colostrum that was spoon fed to infant. Birth Parent was concern she did not have colostrum, LC discussed infant's small tummy size and suggested to latch infant 1st for every feeding before offering formula to help stimulate and establish Birth Parent's milk supply. Birth Parent knows to call RN/LC if she has any BF questions, concerns or need further latch assistance. Mom made aware of O/P services, breastfeeding support groups, community resources, and our phone # for post-discharge questions.   Maternal Data Has patient been taught Hand Expression?: Yes Does the patient have breastfeeding experience prior to this delivery?: Yes How long did the patient breastfeed?: Birth Parent BF 1child for one year, 2nd child for 6 months who is currently 35 years old.  Feeding Mother's Current Feeding Choice: Breast Milk and Formula Nipple Type: Slow - flow  LATCH Score Latch: Grasps breast easily, tongue down, lips flanged, rhythmical sucking.  Audible Swallowing: Spontaneous and intermittent  Type of Nipple: Everted at rest and after stimulation  Comfort (Breast/Nipple): Soft / non-tender  Hold (Positioning): Assistance needed to correctly position infant at breast and maintain latch.  LATCH Score:  9   Lactation Tools Discussed/Used    Interventions Interventions: Breast feeding basics reviewed;Adjust position;Support pillows;Assisted with latch;Skin to skin;Position options;Breast compression;Education;LC Services brochure  Discharge Pump: DEBP;Personal  Consult Status Consult Status: Follow-up Date: 10/21/22 Follow-up type: In-patient    Frederico Hamman 10/20/2022, 10:10 PM

## 2022-10-21 LAB — CBC
HCT: 34.5 % — ABNORMAL LOW (ref 36.0–46.0)
Hemoglobin: 11.5 g/dL — ABNORMAL LOW (ref 12.0–15.0)
MCH: 31 pg (ref 26.0–34.0)
MCHC: 33.3 g/dL (ref 30.0–36.0)
MCV: 93 fL (ref 80.0–100.0)
Platelets: 200 10*3/uL (ref 150–400)
RBC: 3.71 MIL/uL — ABNORMAL LOW (ref 3.87–5.11)
RDW: 14 % (ref 11.5–15.5)
WBC: 10.2 10*3/uL (ref 4.0–10.5)
nRBC: 0 % (ref 0.0–0.2)

## 2022-10-21 MED ORDER — IBUPROFEN 600 MG PO TABS: 600.0000 mg | ORAL_TABLET | Freq: Four times a day (QID) | ORAL | 0 refills | Status: AC

## 2022-10-21 NOTE — Anesthesia Postprocedure Evaluation (Signed)
Anesthesia Post Note  Patient: Eileen Morris [name]  Procedure(s) Performed: AN AD HOC LABOR EPIDURAL     Patient location during evaluation: Mother Baby Anesthesia Type: Epidural Level of consciousness: awake, oriented and awake and alert Pain management: pain level controlled Vital Signs Assessment: post-procedure vital signs reviewed and stable Respiratory status: spontaneous breathing and nonlabored ventilation Cardiovascular status: blood pressure returned to baseline and stable Postop Assessment: no headache, no backache, patient able to bend at knees, able to ambulate and adequate PO intake Anesthetic complications: no   No notable events documented.  Last Vitals:  Vitals:   10/21/22 0200 10/21/22 0520  BP: 120/65 119/63  Pulse: 76 91  Resp: 18 18  Temp: 36.6 C 36.5 C  SpO2: 99% 99%    Last Pain:  Vitals:   10/21/22 0606  TempSrc:   PainSc: Asleep   Pain Goal:                   Sonda Primes

## 2022-10-23 ENCOUNTER — Encounter: Payer: Self-pay | Admitting: Advanced Practice Midwife

## 2022-10-30 ENCOUNTER — Telehealth (HOSPITAL_COMMUNITY): Payer: Self-pay | Admitting: *Deleted

## 2022-10-30 NOTE — Telephone Encounter (Signed)
Left phone voicemail message.  Duffy Rhody, RN 10-30-2022 at 10:32am

## 2022-12-05 ENCOUNTER — Ambulatory Visit (INDEPENDENT_AMBULATORY_CARE_PROVIDER_SITE_OTHER): Payer: Self-pay | Admitting: Obstetrics

## 2022-12-05 ENCOUNTER — Encounter: Payer: Self-pay | Admitting: Obstetrics

## 2022-12-05 DIAGNOSIS — Z30011 Encounter for initial prescription of contraceptive pills: Secondary | ICD-10-CM

## 2022-12-05 DIAGNOSIS — Z3009 Encounter for other general counseling and advice on contraception: Secondary | ICD-10-CM

## 2022-12-05 MED ORDER — NORETHINDRONE 0.35 MG PO TABS: 1.0000 | ORAL_TABLET | Freq: Every day | ORAL | 11 refills | Status: DC

## 2022-12-05 NOTE — Progress Notes (Signed)
[facility] Partum Visit Note  Eileen Morris is a 27 y.o. [phone] female who presents for a postpartum visit. She is 6 weeks postpartum following a normal spontaneous vaginal delivery.  I have fully reviewed the prenatal and intrapartum course. The delivery was at 57 gestational weeks.  Anesthesia: epidural. Postpartum course has been uncomplicated. Baby is doing well. Baby is feeding by breast. Bleeding no bleeding. Bowel function is normal. Bladder function is normal. Patient is not sexually active. Contraception method is oral progesterone-only contraceptive. Postpartum depression screening: negative.   The pregnancy intention screening data noted above was reviewed. Potential methods of contraception were discussed. The patient elected to proceed with No data recorded.   Edinburgh Postnatal Depression Scale - 12/05/22 1336       Edinburgh Postnatal Depression Scale:  In the Past 7 Days   I have been able to laugh and see the funny side of things. 0    I have looked forward with enjoyment to things. 0    I have blamed myself unnecessarily when things went wrong. 0    I have been anxious or worried for no good reason. 0    I have felt scared or panicky for no good reason. 0    Things have been getting on top of me. 0    I have been so unhappy that I have had difficulty sleeping. 0    I have felt sad or miserable. 0    I have been so unhappy that I have been crying. 0    The thought of harming myself has occurred to me. 0    Edinburgh Postnatal Depression Scale Total 0             Health Maintenance Due  Topic Date Due   COVID-19 Vaccine (1) Never done    The following portions of the patient's history were reviewed and updated as appropriate: allergies, current medications, past family history, past medical history, past social history, past surgical history, and problem list.  Review of Systems A comprehensive review of systems was negative.  Objective:  BP 118/77    Pulse 86   Ht 5\' 4"  (1.626 m)   Wt 187 lb 6.4 oz (85 kg)   LMP 01/15/2022   Breastfeeding Yes   BMI 32.17 kg/m    General:  alert and no distress   Breasts:  normal  Lungs: clear to auscultation bilaterally  Heart:  regular rate and rhythm, S1, S2 normal, no murmur, click, rub or gallop  Abdomen: soft, non-tender; bowel sounds normal; no masses,  no organomegaly   Wound none  GU exam:  not indicated       Assessment:    1. Postpartum care following vaginal delivery  2. Encounter for counseling regarding contraception - options discussed.  - wants OCP's  3. Encounter for initial prescription of contraceptive pills Rx: - norethindrone (MICRONOR) 0.35 MG tablet; Take 1 tablet (0.35 mg total) by mouth daily.  Dispense: 28 tablet; Refill: 11    Plan:   Essential components of care per ACOG recommendations:  1.  Mood and well being: Patient with negative depression screening today. Reviewed local resources for support.  - Patient tobacco use? No.   - hx of drug use? No.    2. Infant care and feeding:  -Patient currently breastmilk feeding? Yes. Discussed returning to work and pumping. Reviewed importance of draining breast regularly to support lactation.  -Social determinants of health (SDOH) reviewed in EPIC. No  concerns  3. Sexuality, contraception and birth spacing - Patient does not want a pregnancy in the next year.  Desired family size is 3 children.  - Reviewed reproductive life planning. Reviewed contraceptive methods based on pt preferences and effectiveness.  Patient desired Oral Contraceptive today.   - Discussed birth spacing of 18 months  4. Sleep and fatigue -Encouraged family/partner/community support of 4 hrs of uninterrupted sleep to help with mood and fatigue  5. Physical Recovery  - Discussed patients delivery and complications. She describes her labor as good. - Patient had a Vaginal, no problems at delivery. Patient had a 1st degree laceration.  Perineal healing reviewed. Patient expressed understanding - Patient has urinary incontinence? Yes. Discussed role of pelvic floor PT. - Patient is safe to resume physical and sexual activity  6.  Health Maintenance - HM due items addressed Yes - Last pap smear  Diagnosis  Date Value Ref Range Status  04/04/2022   Final   - Negative for intraepithelial lesion or malignancy (NILM)   Pap smear not done at today's visit.  -Breast Cancer screening indicated? No.   7. Chronic Disease/Pregnancy Condition follow up:  No   Baltazar Najjar, MD [facility], [place], [place] 12/05/2022

## 2023-01-30 IMAGING — US US OB LIMITED
1 series · 11 of 11 positions shown · non-contrast
Comparison: none

[Series 1: us ob limited · 11 of 11 slices shown]
[im 1/11]
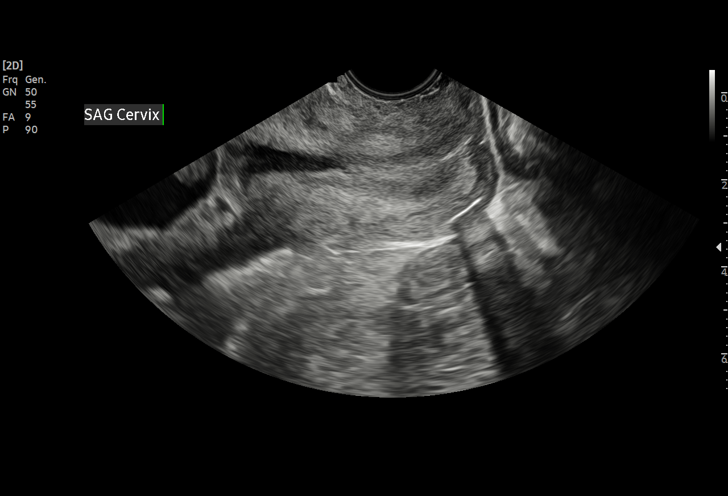
[im 2/11]
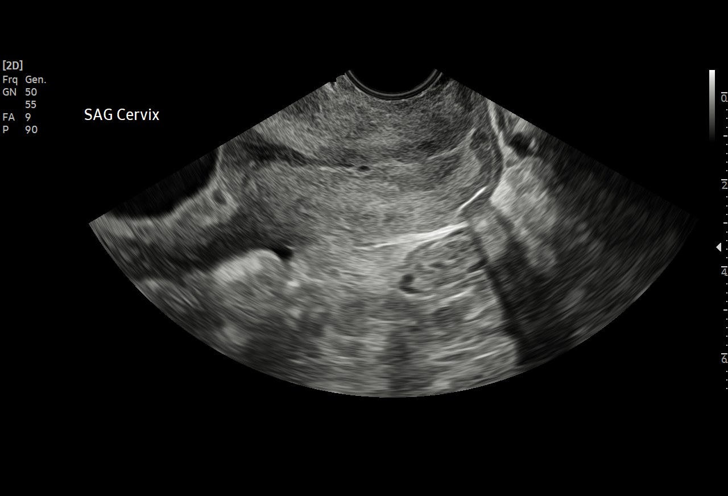
[im 3/11]
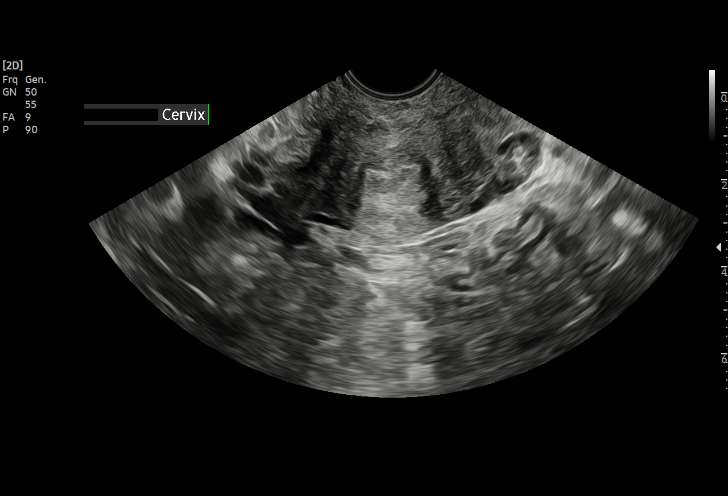
[im 4/11]
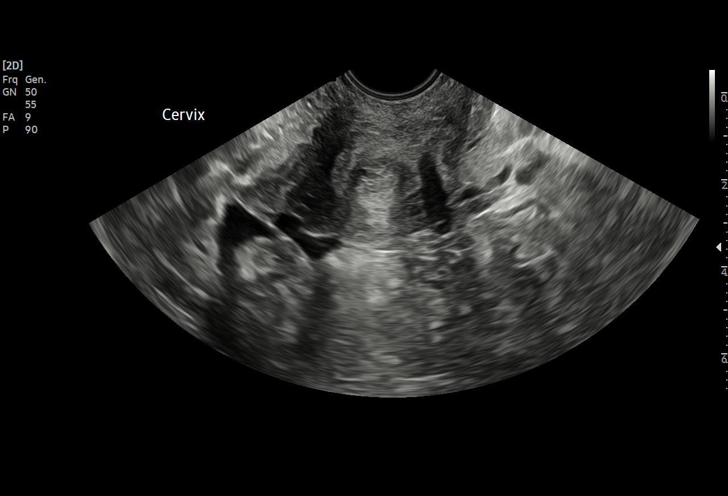
[im 5/11]
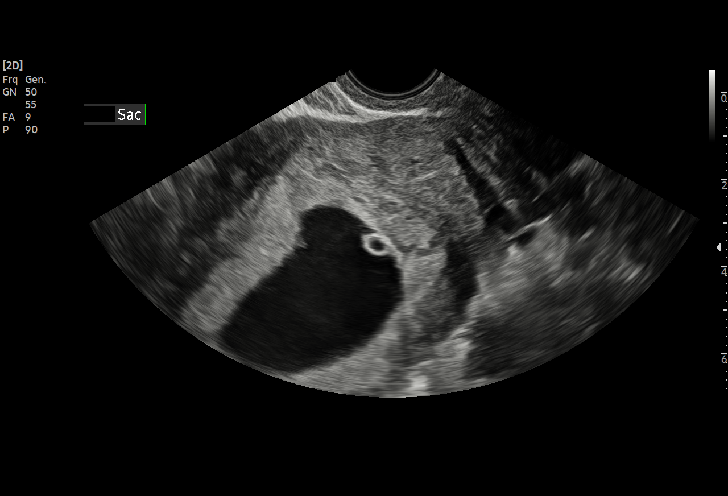
[im 6/11]
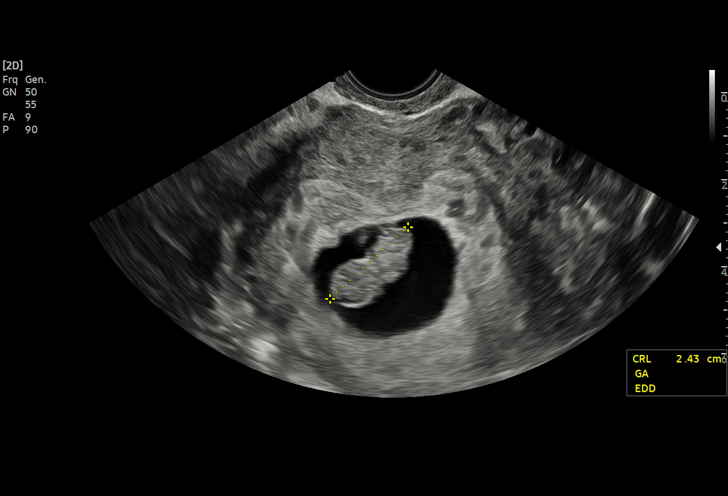
[im 7/11]
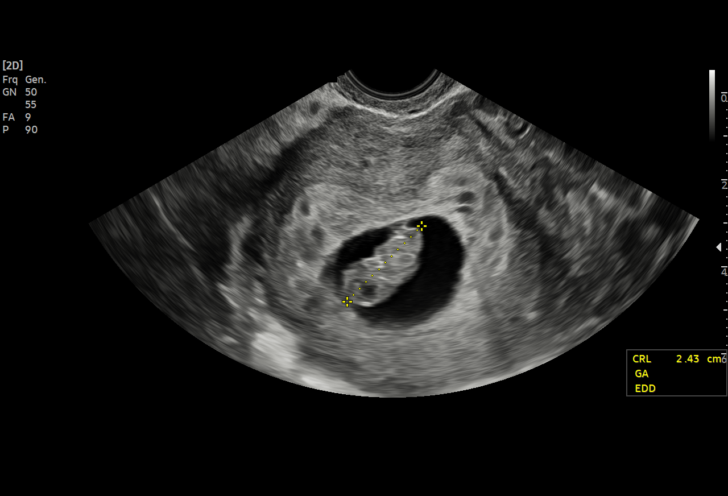
[im 8/11]
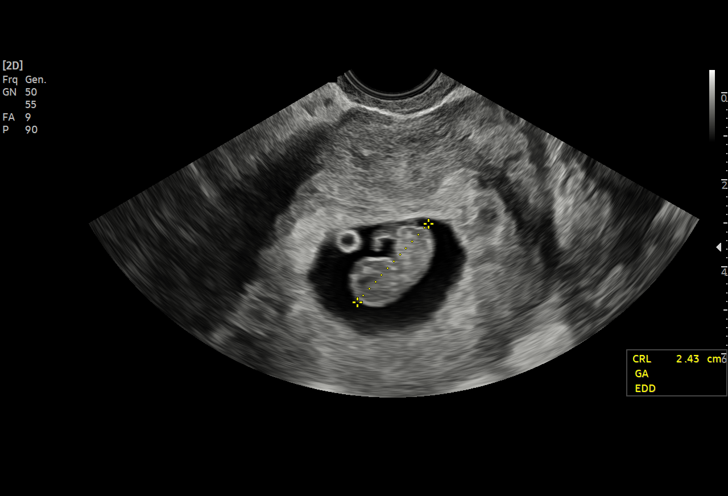
[im 9/11]
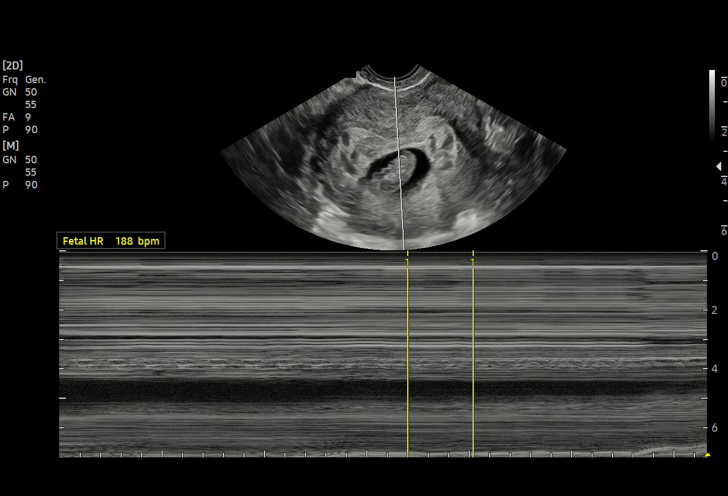
[im 10/11]
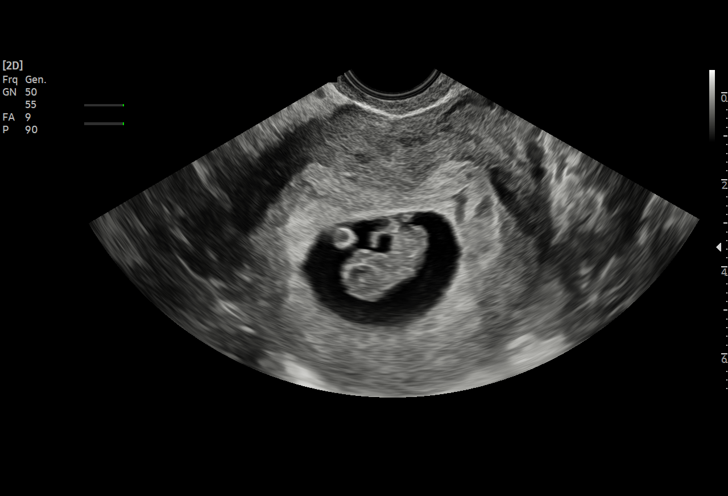
[im 11/11]
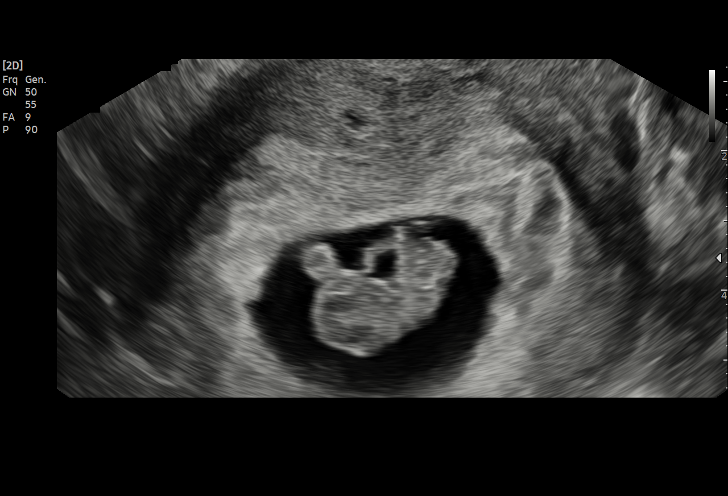

[11 of 11 positions shown; findings below may reference images not displayed]

[REDACTED]care

 1  [HOSPITAL]                         [address].0     TANEKA EASTMAN

Indications

 9 weeks gestation of pregnancy
Fetal Evaluation

 Num Of Fetuses:          1
 Fetal Heart Rate(bpm):   188
 Cardiac Activity:        Observed
Biometry

 CRL:      24.3  mm     G. Age:  9w 0d                   EDD:    10/24/22
Gestational Age

 Best:          9w 0d      Det. By:  U/S C R L (03/21/22)     EDD:   10/24/22
Comments

 Single live IUP at 9w0d by CRL. Measurements are
 consistent with LMP provided 01/15/22.
Impression

 Single IUP 9wod by CRL
 Positive cardiac activity noted
Recommendations
 F/U OB U/S as clinically indicated

## 2023-04-01 ENCOUNTER — Telehealth: Payer: Self-pay

## 2023-04-01 ENCOUNTER — Other Ambulatory Visit: Payer: Self-pay

## 2023-04-01 DIAGNOSIS — B379 Candidiasis, unspecified: Secondary | ICD-10-CM

## 2023-04-01 MED ORDER — FLUCONAZOLE 150 MG PO TABS: 150.0000 mg | ORAL_TABLET | Freq: Once | ORAL | 0 refills | Status: AC

## 2023-04-01 NOTE — Telephone Encounter (Signed)
Patient called requesting prescription for yeast due to being on recent antibiotics from a dental surgery. Diflucan sent to the pharmacy per protocol

## 2023-08-22 ENCOUNTER — Other Ambulatory Visit: Payer: Self-pay | Admitting: Obstetrics and Gynecology

## 2023-08-22 MED ORDER — TERCONAZOLE 0.8 % VA CREA: 1.0000 | TOPICAL_CREAM | Freq: Every day | VAGINAL | 0 refills | Status: AC

## 2023-11-03 ENCOUNTER — Other Ambulatory Visit: Payer: Self-pay | Admitting: Obstetrics

## 2023-11-03 DIAGNOSIS — Z30011 Encounter for initial prescription of contraceptive pills: Secondary | ICD-10-CM

## 2023-11-04 NOTE — Telephone Encounter (Signed)
Pt will need appt to continue refills.

## 2024-01-09 ENCOUNTER — Ambulatory Visit: Payer: Self-pay | Admitting: Obstetrics and Gynecology

## 2024-01-09 ENCOUNTER — Other Ambulatory Visit (HOSPITAL_COMMUNITY)
Admission: RE | Admit: 2024-01-09 | Discharge: 2024-01-09 | Disposition: A | Discharge status: Home / Self Care | Payer: Self-pay | Source: Ambulatory Visit | Attending: Obstetrics and Gynecology | Admitting: Obstetrics and Gynecology

## 2024-01-09 VITALS — BP 122/72 | HR 92 | Wt 176.0 lb

## 2024-01-09 DIAGNOSIS — N760 Acute vaginitis: Secondary | ICD-10-CM

## 2024-01-09 DIAGNOSIS — Z3009 Encounter for other general counseling and advice on contraception: Secondary | ICD-10-CM

## 2024-01-09 MED ORDER — LO LOESTRIN FE 1 MG-10 MCG / 10 MCG PO TABS: 1.0000 | ORAL_TABLET | Freq: Every day | ORAL | 3 refills | Status: DC

## 2024-01-09 MED ORDER — FLUCONAZOLE 150 MG PO TABS: 150.0000 mg | ORAL_TABLET | Freq: Once | ORAL | 0 refills | Status: AC

## 2024-01-09 NOTE — Progress Notes (Signed)
 NEW GYNECOLOGY PATIENT Patient name: Eileen Morris MRN [id number]  Date of birth: October 29, 1996 Chief Complaint:   Gynecologic Exam and Contraception     History:  Eileen Morris is a 28 y.o. [id number] being seen today for birth control management and vaginitis.   On micronor  while breastfeeding and is consdiering switchign to a different pill for birth control. Requesting vaginitis swab today due to concern for yeast infection.  Has been having more frequent symptoms of itching and discharge. Has been with husband for years. Recently changed detergent, previously using free and clear detergent.  With prior OCP noticed low libido, would like lower dose pill to see if she can still get contraceptive benefit without decreased libido.       Gynecologic History No LMP recorded. Contraception: oral progesterone-only contraceptive Last Pap:     Component Value Date/Time   DIAGPAP  04/04/2022 1550    - Negative for intraepithelial lesion or malignancy (NILM)   DIAGPAP  09/01/2020 1623    - Negative for intraepithelial lesion or malignancy (NILM)   DIAGPAP  11/13/2017 0000    NEGATIVE FOR INTRAEPITHELIAL LESIONS OR MALIGNANCY.   ADEQPAP  04/04/2022 1550    Satisfactory for evaluation; transformation zone component PRESENT.   ADEQPAP  09/01/2020 1623    Satisfactory for evaluation; transformation zone component PRESENT.   ADEQPAP  11/13/2017 0000    Satisfactory for evaluation  endocervical/transformation zone component PRESENT.    High Risk HPV: Positive  Adequacy:  Satisfactory for evaluation, transformation zone component PRESENT  Diagnosis:  Atypical squamous cells of undetermined significance (ASC-US )  Last Mammogram: n/a Last Colonoscopy: n/a  Obstetric History OB History  Gravida Para Term Preterm AB Living  5 3 3  2 3   SAB IAB Ectopic Multiple Live Births  2   0 3    # Outcome Date GA Lbr Len/2nd Weight Sex Type Anes PTL Lv  5 Term 10/20/22 [redacted]w[redacted]d 09:42 / 00:02  9 lb 15.4 oz (4.52 kg) F Vag-Spont EPI  LIV  4 Term 12/15/19 [redacted]w[redacted]d 01:57 / 00:28 9 lb 1 oz (4.11 kg) M Vag-Spont EPI  LIV  3 Term 05/04/18 [redacted]w[redacted]d 13:37 / 00:56 9 lb 2.9 oz (4.165 kg) M Vag-Spont EPI  LIV  2 SAB 2018          1 SAB 2017            Past Medical History:  Diagnosis Date   Gestational diabetes    diet controlled   History of hand surgery     Past Surgical History:  Procedure Laterality Date   HAND SURGERY      Current Outpatient Medications on File Prior to Visit  Medication Sig Dispense Refill   acetaminophen  (TYLENOL ) 500 MG tablet Take 500 mg by mouth every 6 (six) hours as needed for headache or mild pain.     Blood Pressure Monitoring (BLOOD PRESSURE KIT) DEVI 1 kit by Does not apply route once a week. (Patient not taking: Reported on 12/05/2022) 1 each 0   cyclobenzaprine  (FLEXERIL ) 10 MG tablet Take 1 tablet (10 mg total) by mouth 2 (two) times daily as needed for muscle spasms. (Patient not taking: Reported on 10/20/2022) 20 tablet 0   DICLEGIS  10-10 MG TBEC Take 2 tablets by mouth at bedtime. If symptoms persist, add one tablet in the morning and one in the afternoon (Patient not taking: Reported on 10/20/2022) 100 tablet 5   Elastic Bandages & Supports (COMFORT FIT MATERNITY SUPP  MED) MISC Wear daily when ambulating (Patient not taking: Reported on 12/05/2022) 1 each 0   ibuprofen  (ADVIL ) 600 MG tablet Take 1 tablet (600 mg total) by mouth every 6 (six) hours. (Patient not taking: Reported on 12/05/2022) 30 tablet 0   norethindrone  (MICRONOR ) 0.35 MG tablet TAKE 1 TABLET BY MOUTH EVERY DAY 84 tablet 1   Prenatal Vit-Fe Fumarate-FA (PRENATAL MULTIVITAMIN) TABS tablet Take 1 tablet by mouth daily at 12 noon.     terconazole  (TERAZOL 3 ) 0.8 % vaginal cream Place 1 applicator vaginally at bedtime. 20 g 0   No current facility-administered medications on file prior to visit.    No Known Allergies  Social History:  reports that she has never smoked. She has never used  smokeless tobacco. She reports that she does not drink alcohol and does not use drugs.  Family History  Problem Relation Age of Onset   Hypertension Mother    Cancer Paternal Grandmother    Hypertension Maternal Aunt     The following portions of the patient's history were reviewed and updated as appropriate: allergies, current medications, past family history, past medical history, past social history, past surgical history and problem list.  Review of Systems Pertinent items noted in HPI and remainder of comprehensive ROS otherwise negative.  Physical Exam:  BP 122/72   Pulse 92   Wt 176 lb (79.8 kg)   BMI 30.21 kg/m  Physical Exam Vitals and nursing note reviewed.  Constitutional:      Appearance: Normal appearance.  Cardiovascular:     Rate and Rhythm: Normal rate.  Pulmonary:     Effort: Pulmonary effort is normal.     Breath sounds: Normal breath sounds.  Neurological:     General: No focal deficit present.     Mental Status: She is alert and oriented to person, place, and time.  Psychiatric:        Mood and Affect: Mood normal.        Behavior: Behavior normal.        Thought Content: Thought content normal.        Judgment: Judgment normal.        Assessment and Plan:   1. Acute vaginitis (Primary) Self swab collected today - diflucan  sent for presumed yeast and will follow up final results. Discussed switching back to hypoallergenic laundry detrgent - Cervicovaginal ancillary only( [place]) - fluconazole  (DIFLUCAN ) 150 MG tablet; Take 1 tablet (150 mg total) by mouth once for 1 dose.  Dispense: 1 tablet; Refill: 0  2. Encounter for counseling regarding contraception The use of the oral contraceptive has been fully discussed with the patient. This includes the proper method to initiate (i.e. Sunday start after next normal menstrual onset versus same day start) and continue the pills, the need for regular compliance to ensure adequate contraceptive effect,  the physiology which make the pill effective, the instructions for what to do in event of a missed pill, and warnings about anticipated minor side effects such as breakthrough spotting, nausea, breast tenderness, weight changes, acne, headaches, etc.  They have been told of the more serious potential side effects such as MI, stroke, and deep vein thrombosis, all of which are very unlikely.  They have been asked to report any signs of such serious problems immediately.  They should back up the pill with a condom during any cycle in which antibiotics are prescribed, and during the first cycle as well. The need for additional protection, such as a condom,  to prevent exposure to sexually transmitted diseases has also been discussed- the patient has been clearly reminded that OCP's cannot protect them against diseases such as HIV and others. They understand and wish to take the medication as prescribed. They was given a prescription for lo lo estrin and will return in 3 months for BP and contraceptive check.  Will notify if there is any concern with new pill.   - LO LOESTRIN FE  1 MG-10 MCG / 10 MCG tablet; Take 1 tablet by mouth daily.  Dispense: 90 tablet; Refill: 3    Routine preventative health maintenance measures emphasized. Please refer to After Visit Summary for other counseling recommendations.   Follow-up: No follow-ups on file.      Carter Quarry, MD Obstetrician & Gynecologist, [facility] [employer], [facility]

## 2024-01-10 ENCOUNTER — Encounter: Payer: Self-pay | Admitting: Obstetrics and Gynecology

## 2024-01-10 ENCOUNTER — Other Ambulatory Visit: Payer: Self-pay | Admitting: Obstetrics and Gynecology

## 2024-01-10 DIAGNOSIS — Z3009 Encounter for other general counseling and advice on contraception: Secondary | ICD-10-CM

## 2024-01-10 LAB — CERVICOVAGINAL ANCILLARY ONLY
Bacterial Vaginitis (gardnerella): NEGATIVE
Candida Glabrata: NEGATIVE
Candida Vaginitis: POSITIVE — AB
Comment: NEGATIVE
Comment: NEGATIVE
Comment: NEGATIVE

## 2024-01-10 MED ORDER — NORETHIN ACE-ETH ESTRAD-FE 1-20 MG-MCG(24) PO TABS: 1.0000 | ORAL_TABLET | Freq: Every day | ORAL | 4 refills | Status: AC
